# Patient Record
Sex: Female | Born: 1965 | Race: Black or African American | Hispanic: No | Marital: Married | Smoking: Never smoker
Health system: Southern US, Community
[De-identification: ages and names within clinical notes are randomized; demographics above are authoritative.]

## PROBLEM LIST (undated history)

## (undated) DIAGNOSIS — N920 Excessive and frequent menstruation with regular cycle: Secondary | ICD-10-CM

## (undated) DIAGNOSIS — J45909 Unspecified asthma, uncomplicated: Secondary | ICD-10-CM

## (undated) DIAGNOSIS — O139 Gestational [pregnancy-induced] hypertension without significant proteinuria, unspecified trimester: Secondary | ICD-10-CM

## (undated) DIAGNOSIS — E669 Obesity, unspecified: Secondary | ICD-10-CM

## (undated) DIAGNOSIS — I10 Essential (primary) hypertension: Secondary | ICD-10-CM

## (undated) HISTORY — DX: Obesity, unspecified: E66.9

## (undated) HISTORY — DX: Unspecified asthma, uncomplicated: J45.909

## (undated) HISTORY — DX: Excessive and frequent menstruation with regular cycle: N92.0

## (undated) HISTORY — DX: Essential (primary) hypertension: I10

## (undated) HISTORY — PX: TUBAL LIGATION: SHX77

## (undated) HISTORY — DX: Gestational (pregnancy-induced) hypertension without significant proteinuria, unspecified trimester: O13.9

---

## 1993-10-17 DIAGNOSIS — O139 Gestational [pregnancy-induced] hypertension without significant proteinuria, unspecified trimester: Secondary | ICD-10-CM

## 1993-10-17 HISTORY — DX: Gestational (pregnancy-induced) hypertension without significant proteinuria, unspecified trimester: O13.9

## 2007-10-18 HISTORY — PX: TUBAL LIGATION: SHX77

## 2007-10-18 HISTORY — PX: ENDOMETRIAL ABLATION: SHX621

## 2007-11-20 ENCOUNTER — Other Ambulatory Visit: Admission: RE | Admit: 2007-11-20 | Discharge: 2007-11-20 | Payer: Self-pay | Admitting: Obstetrics and Gynecology

## 2007-12-04 ENCOUNTER — Encounter: Admission: RE | Admit: 2007-12-04 | Discharge: 2007-12-04 | Payer: Self-pay | Admitting: Obstetrics and Gynecology

## 2008-03-07 ENCOUNTER — Ambulatory Visit (HOSPITAL_COMMUNITY): Admission: RE | Admit: 2008-03-07 | Discharge: 2008-03-07 | Payer: Self-pay | Admitting: Obstetrics & Gynecology

## 2008-11-20 ENCOUNTER — Other Ambulatory Visit: Admission: RE | Admit: 2008-11-20 | Discharge: 2008-11-20 | Payer: Self-pay | Admitting: Obstetrics and Gynecology

## 2008-12-04 ENCOUNTER — Encounter: Admission: RE | Admit: 2008-12-04 | Discharge: 2008-12-04 | Payer: Self-pay | Admitting: Obstetrics and Gynecology

## 2009-05-19 IMAGING — MG MM SCREEN MAMMOGRAM BILATERAL
4 series · 4 of 4 positions shown · non-contrast
Comparison: none

DG SCREEN MAMMOGRAM BILATERAL
Bilateral CC and MLO view(s) were taken.

DIGITAL SCREENING MAMMOGRAM WITH CAD:
The breast tissue is heterogeneously dense.  No masses or malignant type calcifications are 
identified.  Compared with prior studies.

[R CC]
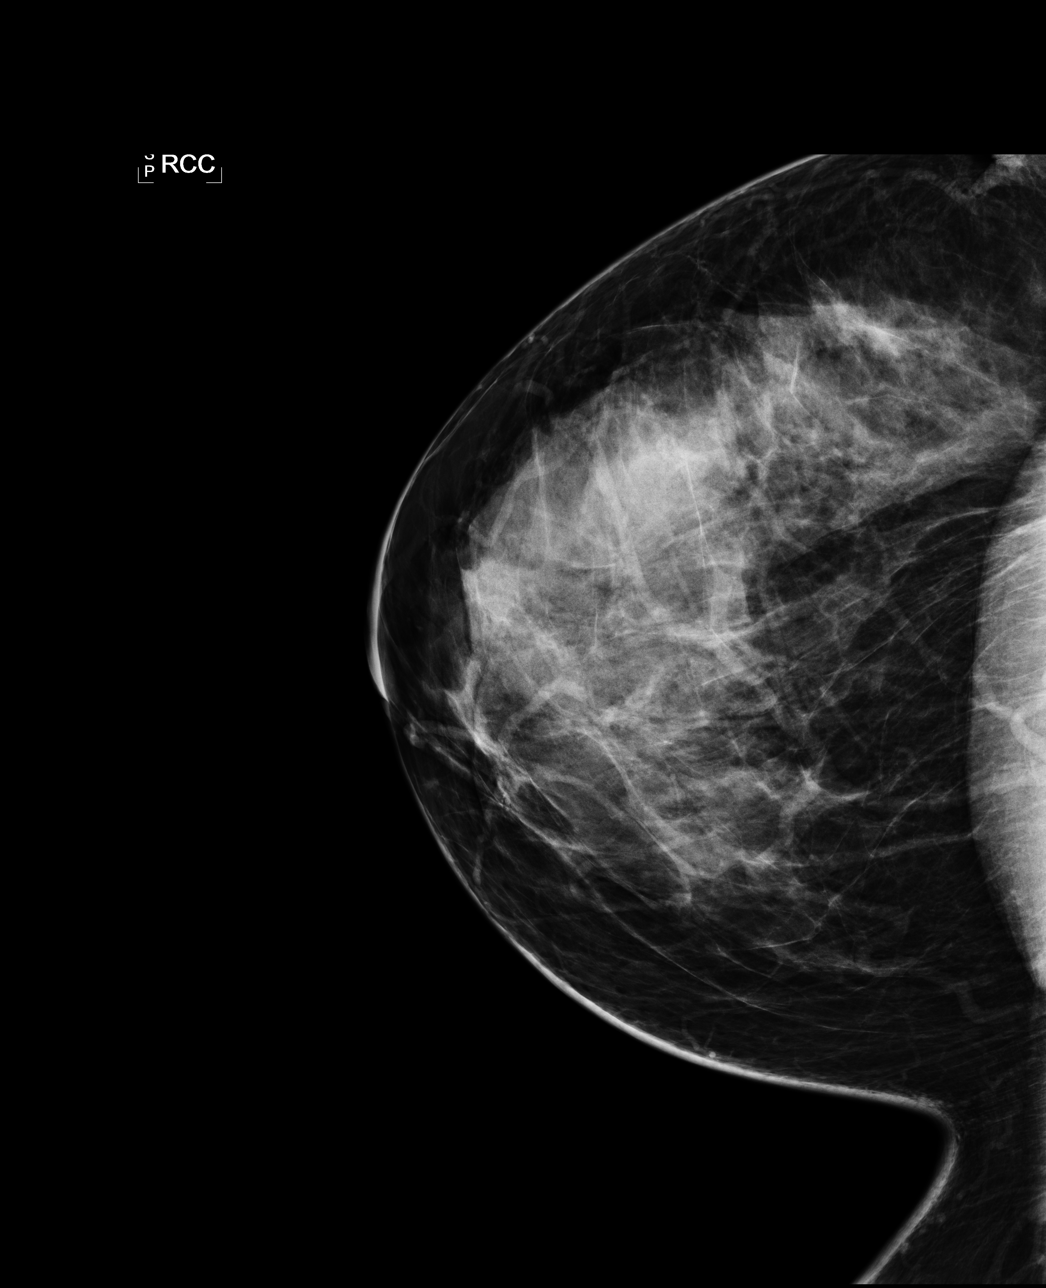

[L CC]
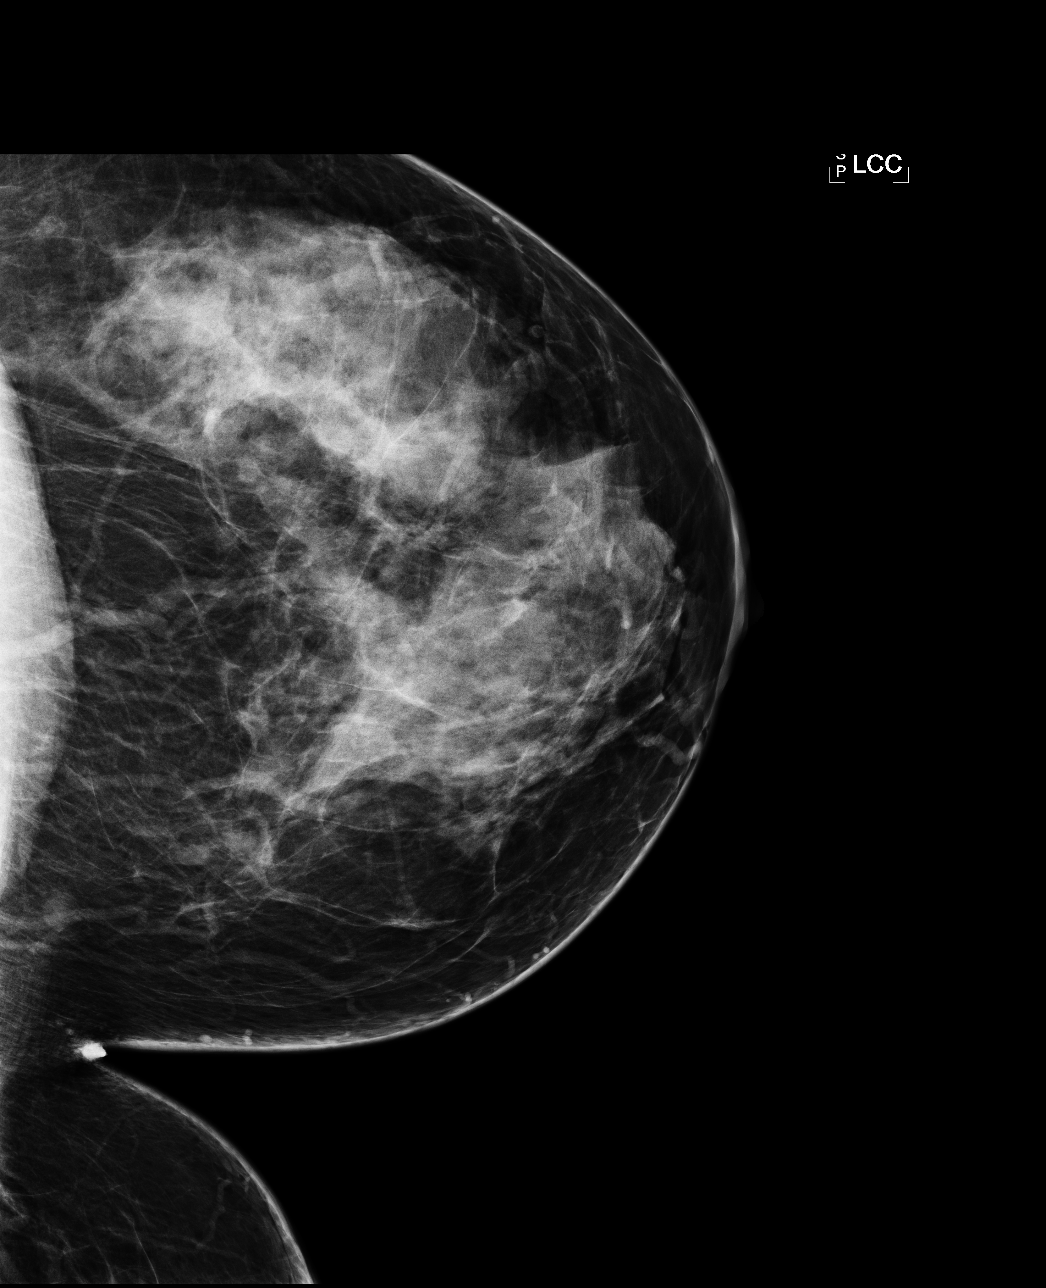

[L MLO]
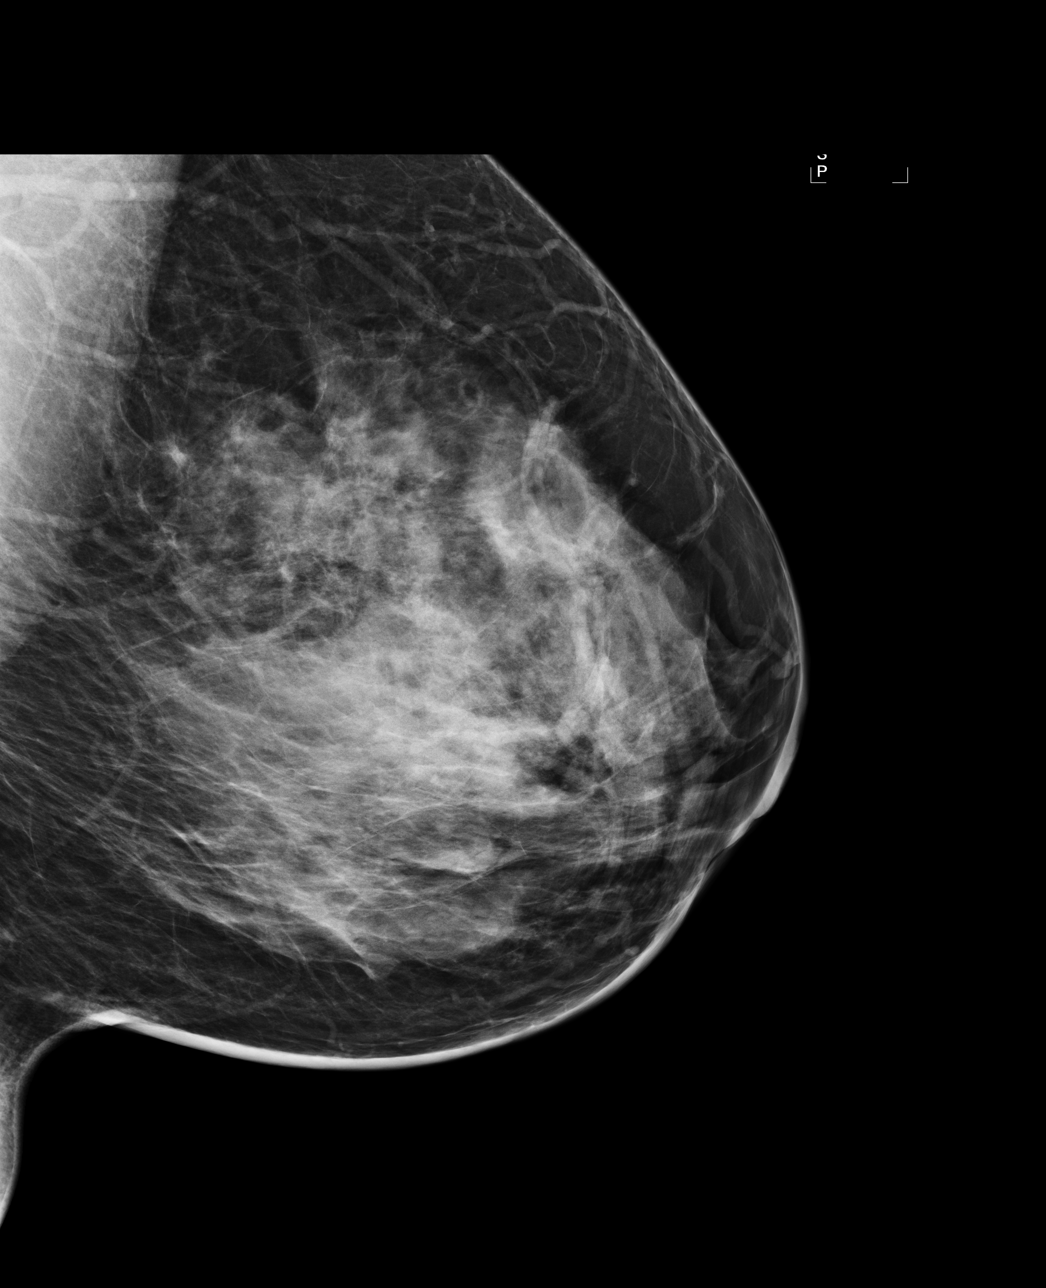

[R MLO]
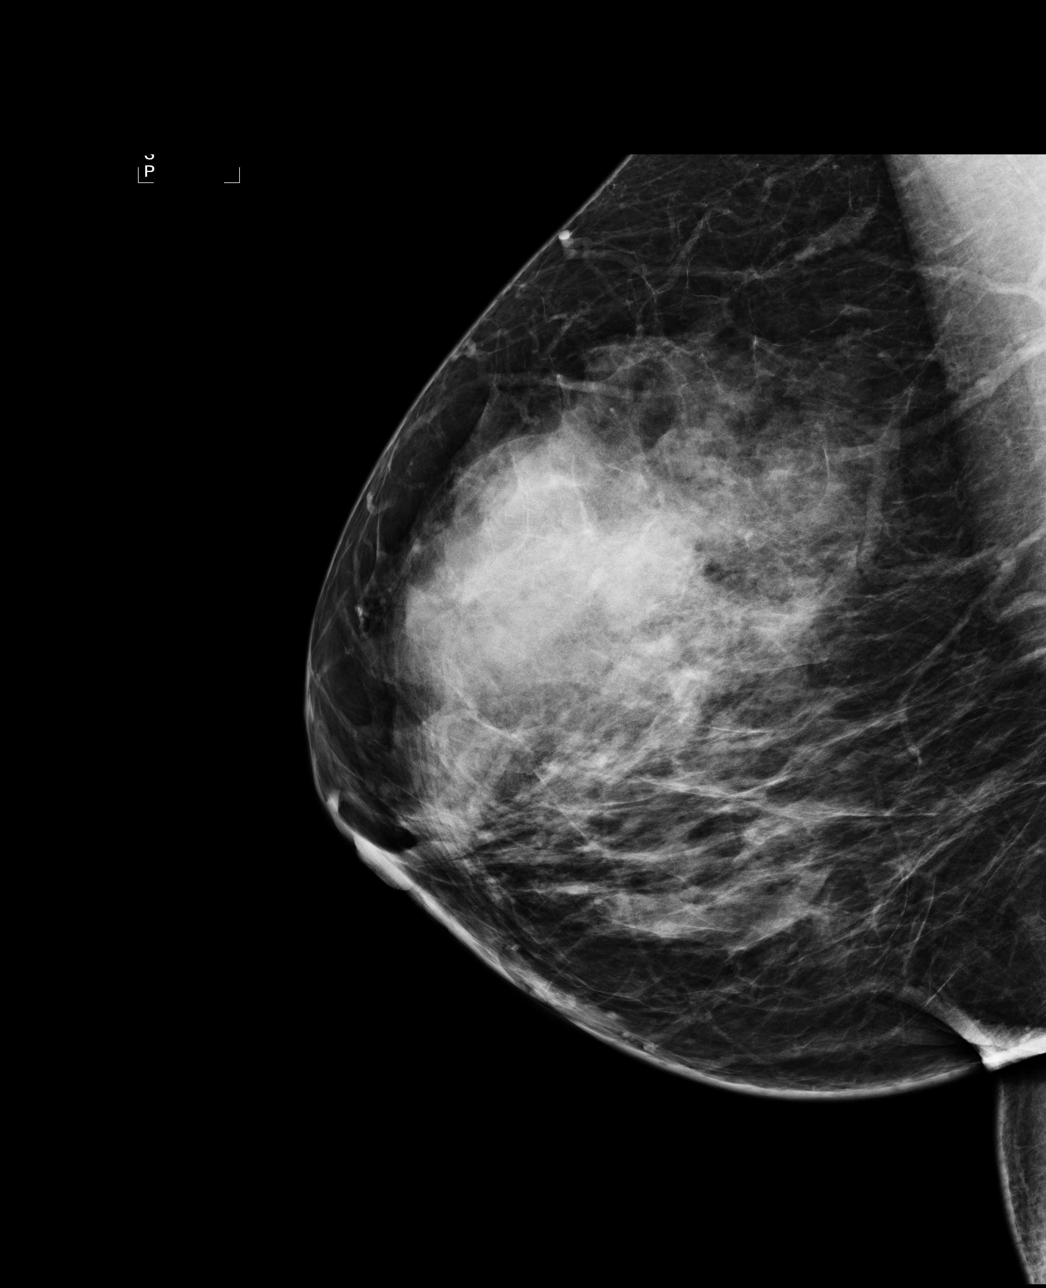

[4 of 4 positions shown; findings below may reference images not displayed]

IMPRESSION: No specific mammographic evidence of malignancy.  Next screening mammogram is recommended in one 
year.

ASSESSMENT: Negative - BI-RADS 1

Screening mammogram in 1 year.
ANALYZED BY COMPUTER AIDED DETECTION. , THIS PROCEDURE WAS A DIGITAL MAMMOGRAM.

## 2009-12-03 ENCOUNTER — Encounter: Admission: RE | Admit: 2009-12-03 | Discharge: 2009-12-03 | Payer: Self-pay | Admitting: Obstetrics & Gynecology

## 2010-06-29 ENCOUNTER — Ambulatory Visit: Payer: Self-pay | Admitting: Cardiovascular Disease

## 2010-11-08 ENCOUNTER — Other Ambulatory Visit: Payer: Self-pay | Admitting: Obstetrics and Gynecology

## 2010-11-08 DIAGNOSIS — Z1231 Encounter for screening mammogram for malignant neoplasm of breast: Secondary | ICD-10-CM

## 2010-11-08 DIAGNOSIS — Z1239 Encounter for other screening for malignant neoplasm of breast: Secondary | ICD-10-CM

## 2010-11-26 ENCOUNTER — Ambulatory Visit
Admission: RE | Admit: 2010-11-26 | Discharge: 2010-11-26 | Disposition: A | Discharge status: Home / Self Care | Payer: Managed Care, Other (non HMO) | Source: Ambulatory Visit | Attending: Obstetrics and Gynecology | Admitting: Obstetrics and Gynecology

## 2010-11-26 DIAGNOSIS — Z1231 Encounter for screening mammogram for malignant neoplasm of breast: Secondary | ICD-10-CM

## 2010-12-29 ENCOUNTER — Ambulatory Visit (INDEPENDENT_AMBULATORY_CARE_PROVIDER_SITE_OTHER): Payer: Managed Care, Other (non HMO) | Admitting: Cardiovascular Disease

## 2010-12-29 DIAGNOSIS — I119 Hypertensive heart disease without heart failure: Secondary | ICD-10-CM

## 2011-03-01 NOTE — Op Note (Signed)
NAMEASHAWNA, HANBACK              ACCOUNT NO.:  [id number]   MEDICAL RECORD NO.:  [id number]          PATIENT TYPE:  AMB   LOCATION:  SDC                           FACILITY:  [facility]   PHYSICIAN:  M. Leda Quail, MD  DATE OF BIRTH:  March 30, 1966   DATE OF PROCEDURE:  03/07/2008  DATE OF DISCHARGE:                               OPERATIVE REPORT   PREOPERATIVE DIAGNOSES:  42. A 45 year old G2, P2, African American female with menorrhagia.  2. Endometrial polyp on sonohysterogram.  3. Desire sterility.  4. History of elevated blood pressures with pressors containing birth      control pills and inability to tolerate these.   POSTOPERATIVE DIAGNOSES:  14. A 45 year old G2, P2, African American female with menorrhagia.  2. Endometrial polyp on sonohysterogram.  3. Desire sterility.  4. History of elevated blood pressures with pressors containing birth      control pills and inability to tolerate these.   PROCEDURE:  1. Hydrothermal ablation.  2. Laparoscopic bilateral tubal ligation with Filshie clip.   SURGEON:  M. Leda Quail, MD   ASSISTANT:  OR staff.   ANESTHESIA:  General endotracheal.   FINDINGS:  A slightly enlarged uterus on hysteroscopy.  No polyps in the  endometrium is present because of her preoperative Aygestin.  On  laparoscopy, she has normal-appearing tubes and ovaries, normal  appendix, normal upper abdomen.  Photo documentation of this was made  and in her chart.   SPECIMENS:  None.   ESTIMATED BLOOD LOSS:  Minimal.   FLUIDS:  1500 mL of LR.   URINE OUTPUT:  300 mL of clear urine drained with a red rubber Foley  catheter at the end of the procedure.   COMPLICATIONS:  None.   INDICATIONS:  Ms. Deretha Emory is very nice 45 year old G2, P2, divorced  African American female who has a history of menorrhagia.  She was tried  initially on birth control pills, but had inability to tolerate these  due to elevated blood pressures.  She was counseled on other  options and  has decided to proceed with an ablation.  She did have a sonohysterogram  done in the office, and this showed a possible polyp versus just of  fluffy endometrium.  Because of this, I did recommend a hydrothermal  ablation.  In the office, endometrial biopsy had been performed and this  showed no abnormal tissue.  She has never done anything for permanent  sterilization and has opted to proceed with the same time of this  procedure.  Risks and benefits have been explained and are documented in  her office chart, and she is here and ready to proceed.  Her blood  pressures in the holding area were elevated before the procedure.  She  was given two doses of labetalol.  She has no history of preexisting  hypertension and has no elevated documented blood pressures in her  office chart.  This is not a common side effect of Aygestin, there is  possibility that this is contributing to it.  With the labetalol, her  blood pressure did come down with diastolics  in the 90s and after  discussing this with anesthesia, we felt it was safe to proceed today.   PROCEDURE IN DETAIL:  The patient was taken to the operating room.  She  was placed in the supine position.  General endotracheal anesthesia was  administered by the anesthesia staff without difficulty.  Legs were  positioned in the low lithotomy position in [facility] stirrups.  She did  have SCD's on bilateral lower extremities.  The legs were elevated and  the patient was prepped with a Betadine prep.  Her abdomen, perineum,  inner thighs, and vagina were prepped using a sterile technique.  The  patient was then draped in normal sterile fashion.  Attention was turned  toward the vagina.  Bivalved speculum was placed in the vagina.  The  anterior lip of the cervix was grasped with a single-tooth tenaculum.  The uterus sounded to 9 cm.  The cervix was then dilated up to #21.  A 5-  mm hysteroscope with the VAC apparatus attached to it was  obtained.  Attempt to pass this through the cervical canal was made.  Ultimately,  the cervix had to be dilated up to a #23.  At this point, the  hysteroscope was advanced through the cervix and had a very tight seal  even dilating up to #23.  The cavity was visualized and tubal ostia were  noted bilaterally.  Photo documentation of the thin endometrium and  tubal ostia was made.  There was no evidence of polyps with hysteroscopy  at this point.  The scope was positioned in the lower uterine segment.  At this point, the ablation was initiated.  The hysteroscopic fluid was  heated to approximately 90 degrees Celsius.  Then a 10-minute ablation  cycle was performed without difficulty.  Good blanching of the  endometrial cavity was noted and photo documentation was made.  The cool  cycle was performed and only after this was complete, the scope was  removed from the cervix.  The bivalved speculum was still in place at  this point, and an acorn uterine manipulator was passed through the  cervical os and attached to the tenaculum as a mean to manipulate the  uterus over the next portion of the procedure.  At this point, the  speculum was removed.  The legs were positioned down into the low  lithotomy position.  Attention was turned to the abdomen at this point.  Marcaine 0.5% was used to anesthetize beneath the umbilicus.  5 mL of  this was used.  Using a #11 blade a 1-cm skin incision is made beneath  the umbilicus.  The subcutaneous fat tissue was dissected with a  hemostat.  The abdomen was elevated, and a Veress needle was passed  through the abdominal wall layers once popped.  Visceral peritoneum was  noted.  A syringe of normal saline was obtained.  This was attached to  the Veress needle.  An aspiration was performed without any fluid or  blood being noted and then fluid was injected and aspiration was  performed again without return of any fluid.  Fluid dripped easily down  the Veress  needle.  At this point, CO2 gas was attached to the Veress  needle, and pneumoperitoneum was initiated under low flows.  There were  no pressures greater than 7 mmHg as the pneumoperitoneum was being  released.  Once 2.5 mL of CO2 gas was in place, the Veress needle was  removed.  The abdominal wall  layer was then grasped again and elevated.  The diagnostic laparoscope was attached to a #10 Optiview port and  trocar.  Using a twisting motion, this was passed through the abdominal  wall layers.  They were visualized as they were traversed.  Once  intraperitoneal placement was noted, the trocar was removed and the  laparoscope was placed down in the port without difficulty.  Patient was  placed in trendelenberg positioning.  Pelvis and upper abdomen were  surveyed.  Decision had been made to go ahead and place Filshie clips on  the tubes.  The tubes were both identified and carried out to the  fimbriated end.  Then Filshie clips were placed bilaterally on the  isthmic region of the fallopian tube.  This was done without difficulty.  Photo documentation of this was made.  At this point, this portion of  the procedure was ended.  Patient placed back in supine position.  The  laparoscope was removed.  The pneumoperitoneum was released.  Several  good breaths from the anesthesiologist was given to ensure that the  abdomen was emptying as much her carbon dioxide as possible.  The  midline port was removed.  The operator's index finger was placed in  this incision to ensure there was no bowel in place.  Using S  retractors, the fascial edges were visualized and grasped with pickup  teeth.  Using figure-of-eight suture of #0 Vicryl, the fascia was closed  in the midline.  These sutures were cut short.  Skin was closed with  subcuticular stitch of 3-0 Vicryl.  Skin was then cleansed and a  dressing was placed above it.  At this point, legs were lifted into the  high lithotomy position.  The  instruments were removed from the vagina  and using a speculum the cervix was visualized.  There was a small  amount of bleeding from the right tenaculum site on the anterior lip of  the cervix.  This was made hemostatic with silver nitrate.  At this  point, the procedure was completely done and the speculum was removed.  Legs were positioned back in the supine position.   The patient tolerated the procedure well.  She was awaken from  anesthesia without difficulty.  She was very stable during the  procedure, and her blood pressures came down very nicely with  anesthesia.  Sponge, lap, needle, and instrument counts were correct x2.  The patient at this point was taken to the recovery room in stable  condition.      Lum Keas, MD  Electronically Signed     MSM/MEDQ  D:  03/07/2008  T:  03/08/2008  Job:  [id number]

## 2011-03-09 ENCOUNTER — Ambulatory Visit (INDEPENDENT_AMBULATORY_CARE_PROVIDER_SITE_OTHER): Payer: Managed Care, Other (non HMO) | Admitting: Family Medicine

## 2011-03-09 ENCOUNTER — Encounter: Payer: Self-pay | Admitting: Family Medicine

## 2011-03-09 VITALS — BP 140/100 | HR 71 | Temp 98.3°F | Ht 67.0 in | Wt 199.0 lb

## 2011-03-09 DIAGNOSIS — R030 Elevated blood-pressure reading, without diagnosis of hypertension: Secondary | ICD-10-CM

## 2011-03-09 DIAGNOSIS — Z0000 Encounter for general adult medical examination without abnormal findings: Secondary | ICD-10-CM

## 2011-03-09 DIAGNOSIS — IMO0001 Reserved for inherently not codable concepts without codable children: Secondary | ICD-10-CM

## 2011-03-09 LAB — BASIC METABOLIC PANEL
BUN: 10 mg/dL (ref 6–23)
Calcium: 9.2 mg/dL (ref 8.4–10.5)
Potassium: 3.9 mEq/L (ref 3.5–5.3)
Sodium: 139 mEq/L (ref 135–145)

## 2011-03-09 NOTE — Patient Instructions (Signed)
Please take some blood pressure readings at home--maybe three times a week--at different times of the day (stressed and unstressed). Mail them to me and I will get in touch with you regarding the results I am checking a basic metabolic panel today and I will send you those results. Great to meet you! Keep up the great work with your diet and exercise!  Unless something comes up in your blood work or on the blood pressure readings, I will plan on seeing you next February for a CPE (complete physical exam)

## 2011-03-10 ENCOUNTER — Encounter: Payer: Self-pay | Admitting: Family Medicine

## 2011-03-10 DIAGNOSIS — Z0000 Encounter for general adult medical examination without abnormal findings: Secondary | ICD-10-CM | POA: Insufficient documentation

## 2011-03-10 NOTE — Progress Notes (Signed)
  Subjective:    Patient ID: Ashley Schneider, female    DOB: 04-12-1966, 45 y.o.   MRN: [id number]  HPI  New patient here to establish care 1)BP:   formerly very overweight, has lost 30 pounds with diet and exercise. Was on BP meds during her pregnancy and up until her weight loss--she has been off them several months and reports BP 130/70 range at home  Was on HCTZ and K Dur mopst recently and methyldopa during her pregnancy  2) Prevention: last pap normal  Feb 2012.  Also had mammogram then. Tdap 11/2010.  3) Menses previously heavy and had endometrial ablation and BTL  2009. G2P2  SH: divorced, 2 kids Ashley Schneider born 1995 and Ashley Schneider 1998, both live w her. She works as Sport and exercise psychologist for [employer]    complete 14 point ROS negative for pertinent positives Objective:   Physical Exam    GENERALl: Well developed,overweight;  well nourished, in no acute distress. NECK: Supple, FROM, without lymphadenopathy.  THYROID: normal without nodularity CAROTID ARTERIES: without bruits LUNGS: clear to auscultation bilaterally. No wheezes or rales. HEART: Regular rate and rhythm, no murmurs ABDOMEN: soft with positive bowel sounds NEURO: No gross focal deficits. DTrs 1+ B = U/L      Assessment & Plan:  1) elevated BP--she will take some readings and rtc 3-4w. Labs today encouraged continue great exercise and eating plan.

## 2011-03-15 ENCOUNTER — Telehealth: Payer: Self-pay | Admitting: Family Medicine

## 2011-03-15 NOTE — Telephone Encounter (Signed)
Does not sound like anything I would be really worried about. See if she can come in next 4-6 weeks THANKS! Denny Levy

## 2011-03-15 NOTE — Telephone Encounter (Signed)
Left message with pt's daughter Morrie Sheldon and asked her to tell her mom to call our office.Laureen Ochs, Viann Shove

## 2011-03-15 NOTE — Telephone Encounter (Signed)
Reports BP readings for the last 4 days: Friday -am - 141/100 Friday - pm- 140/98 Sun - 138/100 Mon - 139/101  Saw stars on Friday when she was just sitting at desk at work.Marland KitchenMarland Kitchen

## 2011-03-15 NOTE — Telephone Encounter (Signed)
Pt cannot come in this week or next and wants to know what Dr Jennette Kettle would recommend.

## 2011-03-15 NOTE — Telephone Encounter (Signed)
Spoke with patient and informed her to make an appointment with Dr. Jennette Kettle. Will also fwd results to Dr. Dellia Cloud, Roselyn Meier

## 2011-03-15 NOTE — Telephone Encounter (Signed)
What should I tell this pt?  ~T

## 2011-03-15 NOTE — Telephone Encounter (Signed)
Informed pt of what Dr. Jennette Kettle said and to make an appt in 4-6 weeks. Also advised her that if she starts feeling bad to make an appt to be seen before then.Laureen Ochs, Viann Shove '

## 2011-03-18 ENCOUNTER — Telehealth: Payer: Self-pay | Admitting: Cardiovascular Disease

## 2011-03-18 DIAGNOSIS — I10 Essential (primary) hypertension: Secondary | ICD-10-CM

## 2011-03-18 MED ORDER — HYDROCHLOROTHIAZIDE 25 MG PO TABS: 25.0000 mg | ORAL_TABLET | Freq: Every day | ORAL | Status: DC

## 2011-03-18 MED ORDER — POTASSIUM CHLORIDE 20 MEQ PO PACK: 20.0000 meq | PACK | Freq: Every day | ORAL | Status: DC

## 2011-03-18 NOTE — Telephone Encounter (Signed)
Saw pcp dr Lloyd Huger, wants your advise though, bp been 138/96, 136/ 101, 142/102, dr Lloyd Huger wanted to start her on diazide but she was resistant, she has lost weight and exercises 4x week. Wants you to make medication decision.Alfonso Ramus RN

## 2011-03-18 NOTE — Telephone Encounter (Signed)
meds ordered and lab ordered. Keep checking bp daily.Pt verbalized understanding. Alfonso Ramus RN

## 2011-03-18 NOTE — Telephone Encounter (Signed)
Pt said her bp has been above 135/96 for couple days she went to pcp and dr wanted to start her on a pill with hct.potassium combined please call back

## 2011-03-18 NOTE — Telephone Encounter (Signed)
She persuaded me to stop her BP meds when I saw her last visit.  It appears that her BP has gone  Back up and I would also advise that she go back on her meds.  She can take the diazide or resume the HCTZ  25and KCl  20 meq  that I had her on.

## 2011-04-05 ENCOUNTER — Telehealth: Payer: Self-pay | Admitting: Family Medicine

## 2011-04-05 NOTE — Telephone Encounter (Signed)
Pt seen on 03/09/11, says it was supposed to be for her annual physical but was not coded correctly therefore pt is being billed, needs MD to change dx code so that the billing office can correct & pts ins will pay.

## 2011-04-11 NOTE — Telephone Encounter (Signed)
Babs She WAS scheduled for CPE--actually took care of her BP mostly so I coded it that way BUT it could go either way. Evidently easier for her if it is CPE so can you change that for her? THANKS! Denny Levy

## 2011-05-19 ENCOUNTER — Telehealth: Payer: Self-pay | Admitting: Family Medicine

## 2011-05-19 NOTE — Telephone Encounter (Signed)
[name] is calling to make sure that the coding change has been made.  Please give her a call.

## 2011-06-30 NOTE — Telephone Encounter (Signed)
Unfortunately I can' go into the orders screen.  I don't have access to that.

## 2011-07-04 NOTE — Telephone Encounter (Signed)
I have put this on Denyas desk  I cannot alter the order I am routing to [employer] incase SHE has a way to fix this THANKS! Denny Levy

## 2011-07-13 LAB — URINALYSIS, ROUTINE W REFLEX MICROSCOPIC
Bilirubin Urine: NEGATIVE
Glucose, UA: NEGATIVE
Ketones, ur: 15 — AB
Protein, ur: 30 — AB
Specific Gravity, Urine: >1.03 — ABNORMAL HIGH
Urobilinogen, UA: 0.2
pH: 6

## 2011-07-13 LAB — CBC
Hemoglobin: 12.5
MCV: 84.4
RBC: 4.37
RDW: 13.7

## 2011-07-13 LAB — PREGNANCY, URINE: Preg Test, Ur: NEGATIVE

## 2011-07-13 LAB — URINE MICROSCOPIC-ADD ON

## 2011-07-13 NOTE — Telephone Encounter (Signed)
Will submit a ticket on this.

## 2011-10-13 ENCOUNTER — Ambulatory Visit (INDEPENDENT_AMBULATORY_CARE_PROVIDER_SITE_OTHER): Payer: Managed Care, Other (non HMO) | Admitting: *Deleted

## 2011-10-13 VITALS — BP 136/96 | HR 64

## 2011-10-13 DIAGNOSIS — Z0000 Encounter for general adult medical examination without abnormal findings: Secondary | ICD-10-CM

## 2011-10-13 NOTE — Progress Notes (Signed)
Form signed by Dr. Jennette Kettle and faxed.

## 2011-10-13 NOTE — Progress Notes (Signed)
Patient comes to office today  requesting BP check , waist measurement , and recording of lab results she obtained  on 10/10/2011 at [facility] in [place], [place]. She states she was fasting for this bloodwork.   She has a form from [employer],  Research scientist (physical sciences) for [employer] that she requesst MD sign and fax to number provided on form.   She signed ROI to release this information.  BP today LA 136/96 pulse 64 , waist measurement 38 inches. Form  Placed in MD box.

## 2011-10-20 ENCOUNTER — Other Ambulatory Visit: Payer: Self-pay | Admitting: Obstetrics and Gynecology

## 2011-10-20 DIAGNOSIS — Z1231 Encounter for screening mammogram for malignant neoplasm of breast: Secondary | ICD-10-CM

## 2011-12-12 LAB — HM PAP SMEAR: HM Pap smear: NEGATIVE

## 2011-12-21 ENCOUNTER — Ambulatory Visit
Admission: RE | Admit: 2011-12-21 | Discharge: 2011-12-21 | Disposition: A | Discharge status: Home / Self Care | Payer: Managed Care, Other (non HMO) | Source: Ambulatory Visit | Attending: Obstetrics and Gynecology | Admitting: Obstetrics and Gynecology

## 2011-12-21 DIAGNOSIS — Z1231 Encounter for screening mammogram for malignant neoplasm of breast: Secondary | ICD-10-CM

## 2012-01-10 ENCOUNTER — Encounter: Payer: Self-pay | Admitting: *Deleted

## 2012-03-13 ENCOUNTER — Telehealth: Payer: Self-pay | Admitting: Cardiovascular Disease

## 2012-03-13 MED ORDER — POTASSIUM CHLORIDE 20 MEQ PO PACK: 20.0000 meq | PACK | Freq: Every day | ORAL | Status: DC

## 2012-03-13 MED ORDER — HYDROCHLOROTHIAZIDE 25 MG PO TABS: 25.0000 mg | ORAL_TABLET | Freq: Every day | ORAL | Status: DC

## 2012-03-13 NOTE — Telephone Encounter (Signed)
New Problem:    Patient called in needing a refill of her hydrochlorothiazide 25 MG tablet and potassium chloride (KLOR-CON) 20 MEQ packet.

## 2012-03-13 NOTE — Telephone Encounter (Signed)
Pt needs appointment then refill can be made Fax Received. Refill Completed. Shirly Bartosiewicz Chowoe (R.M.A)   

## 2012-03-30 ENCOUNTER — Ambulatory Visit (INDEPENDENT_AMBULATORY_CARE_PROVIDER_SITE_OTHER): Payer: Managed Care, Other (non HMO) | Admitting: Nurse Practitioner

## 2012-03-30 ENCOUNTER — Encounter: Payer: Self-pay | Admitting: Nurse Practitioner

## 2012-03-30 VITALS — BP 130/90 | HR 71 | Ht 67.0 in | Wt 207.2 lb

## 2012-03-30 DIAGNOSIS — I10 Essential (primary) hypertension: Secondary | ICD-10-CM

## 2012-03-30 MED ORDER — HYDROCHLOROTHIAZIDE 25 MG PO TABS: 25.0000 mg | ORAL_TABLET | Freq: Every day | ORAL | Status: DC

## 2012-03-30 MED ORDER — POTASSIUM CHLORIDE 20 MEQ PO PACK: 20.0000 meq | PACK | Freq: Every day | ORAL | Status: DC

## 2012-03-30 NOTE — Progress Notes (Addendum)
   Ashley Schneider Date of Birth: 1966/03/21 Medical Record #[id number]  History of Present Illness: Ashley Schneider is seen today for a work in visit. She is seen for Dr. Elease Hashimoto. She needs medicines refilled. She has HTN and obesity. Her last visit was in March of 2012.   She comes in today. She is here alone. She is doing well. No complaints. Needed medicines refilled. Her weight has gone back up unfortunately. She notes that when she loses weight it is only in her upper chest and because her breasts are so large, her back hurts more. No cardiac complaints. Blood pressure at home has been good. She monitors her pressure regularly. Has had all of her labs with her PCP. In general, she has no complaint.   Current Outpatient Prescriptions on File Prior to Visit  Medication Sig Dispense Refill  . BIOTIN PO Take by mouth.      . hydrochlorothiazide (HYDRODIURIL) 25 MG tablet Take 1 tablet (25 mg total) by mouth daily.  90 tablet  0  . Multiple Vitamin (MULTIVITAMIN) tablet Take 1 tablet by mouth daily.      . potassium chloride (KLOR-CON) 20 MEQ packet Take 20 mEq by mouth daily.  90 tablet  0    Allergies  Allergen Reactions  . Penicillins     Past Medical History  Diagnosis Date  . Hypertension   . Pregnancy induced hypertension 1995    Past Surgical History  Procedure Date  . Tubal ligation   . Endometrial ablation 2009    History  Smoking status  . Never Smoker   Smokeless tobacco  . Never Used    History  Alcohol Use No    Family History  Problem Relation Age of Onset  . Hypertension Mother   . Diabetic kidney disease Father     Review of Systems: The review of systems is per the HPI.  All other systems were reviewed and are negative.  Physical Exam: BP 140/94  Pulse 71  Ht 5\' 7"  (1.702 m)  Wt 207 lb 3.2 oz (93.985 kg)  BMI 32.45 kg/m2 Repeat blood pressure by me was 130/90 (she had rushed here because she went to the wrong office) Patient is very pleasant  and in no acute distress. She is obese. Skin is warm and dry. Color is normal.  HEENT is unremarkable. Normocephalic/atraumatic. PERRL. Sclera are nonicteric. Neck is supple. No masses. No JVD. Lungs are clear. Cardiac exam shows a regular rate and rhythm. Abdomen is soft. Extremities are without edema. Gait and ROM are intact. No gross neurologic deficits noted.   LABORATORY DATA: N/A  EKG today shows sinus rhythm and is normal.   Assessment / Plan:

## 2012-03-30 NOTE — Assessment & Plan Note (Signed)
Blood pressure is ok at home. Her medicines are refilled. We will see her back in a year. She will continue to monitor her blood pressure at home. Patient is agreeable to this plan and will call if any problems develop in the interim.

## 2012-03-30 NOTE — Patient Instructions (Signed)
   Continue with your current medicines.  Monitor your blood pressure at home.   Record your readings and bring to your next visit.  Limit sodium intake.  We will see you back in a year.  Call the [facility] office at [phone] if you have any questions, problems or concerns.

## 2012-08-17 ENCOUNTER — Ambulatory Visit (HOSPITAL_COMMUNITY)
Admission: RE | Admit: 2012-08-17 | Discharge: 2012-08-17 | Disposition: A | Discharge status: Home / Self Care | Payer: Managed Care, Other (non HMO) | Source: Ambulatory Visit | Attending: Family Medicine | Admitting: Family Medicine

## 2012-08-17 ENCOUNTER — Ambulatory Visit (INDEPENDENT_AMBULATORY_CARE_PROVIDER_SITE_OTHER): Payer: Managed Care, Other (non HMO) | Admitting: Family Medicine

## 2012-08-17 ENCOUNTER — Encounter: Payer: Self-pay | Admitting: Family Medicine

## 2012-08-17 VITALS — BP 126/89 | HR 76 | Ht 67.0 in | Wt 198.0 lb

## 2012-08-17 DIAGNOSIS — R079 Chest pain, unspecified: Secondary | ICD-10-CM | POA: Insufficient documentation

## 2012-08-17 DIAGNOSIS — I10 Essential (primary) hypertension: Secondary | ICD-10-CM

## 2012-08-17 DIAGNOSIS — Z0000 Encounter for general adult medical examination without abnormal findings: Secondary | ICD-10-CM

## 2012-08-17 LAB — COMPREHENSIVE METABOLIC PANEL
Alkaline Phosphatase: 86 U/L (ref 39–117)
BUN: 7 mg/dL (ref 6–23)
CO2: 29 mEq/L (ref 19–32)
Calcium: 9.2 mg/dL (ref 8.4–10.5)
Chloride: 99 mEq/L (ref 96–112)
Glucose, Bld: 81 mg/dL (ref 70–99)
Potassium: 3.1 mEq/L — ABNORMAL LOW (ref 3.5–5.3)
Sodium: 138 mEq/L (ref 135–145)
Total Protein: 7.2 g/dL (ref 6.0–8.3)

## 2012-08-17 LAB — POCT URINALYSIS DIPSTICK
Bilirubin, UA: NEGATIVE
Ketones, UA: NEGATIVE
Nitrite, UA: NEGATIVE
Protein, UA: NEGATIVE
Spec Grav, UA: 1.02
Urobilinogen, UA: 0.2

## 2012-08-17 LAB — CBC
MCH: 30.5 pg (ref 26.0–34.0)
MCV: 86.5 fL (ref 78.0–100.0)
RDW: 12.6 % (ref 11.5–15.5)
WBC: 5 10*3/uL (ref 4.0–10.5)

## 2012-08-17 LAB — LIPID PANEL
LDL Cholesterol: 99 mg/dL (ref 0–99)
VLDL: 30 mg/dL (ref 0–40)

## 2012-08-17 LAB — TSH: TSH: 0.443 u[IU]/mL (ref 0.350–4.500)

## 2012-08-17 NOTE — Progress Notes (Signed)
  Subjective:     Ashley Schneider is a 46 y.o. female and is here for a comprehensive physical exam. The patient reports no problems. Is currently part of [employer], which is a personalized health plan through her insurance. Is interested in multiple "check up" labs that are covered by her insurance including: UA, EKG, CBC, CMET, FLP, TSH  History   Social History  . Marital Status: Single    Spouse Name: N/A    Number of Children: N/A  . Years of Education: N/A   Occupational History  . Not on file.   Social History Main Topics  . Smoking status: Never Smoker   . Smokeless tobacco: Never Used  . Alcohol Use: No  . Drug Use: No  . Sexually Active: Not Currently    Birth Control/ Protection: Other-see comments   Other Topics Concern  . Not on file   Social History Narrative  . No narrative on file   Health Maintenance  Topic Date Due  . Pap Smear  11/27/1985  . Tetanus/tdap  11/27/1986  . Influenza Vaccine  06/17/2012    The following portions of the patient's history were reviewed and updated as appropriate: allergies, current medications, past family history, past medical history, past social history, past surgical history and problem list.  Review of Systems A comprehensive review of systems was negative.   Objective:    BP 126/89  Pulse 76  Ht 5\' 7"  (1.702 m)  Wt 198 lb (89.812 kg)  BMI 31.01 kg/m2 General appearance: alert, cooperative, appears stated age and no distress Head: Normocephalic, without obvious abnormality, atraumatic Eyes: conjunctivae/corneas clear. PERRL, EOM's intact. Fundi benign. Ears: normal TM's and external ear canals both ears Nose: Nares normal. Septum midline. Mucosa normal. No drainage or sinus tenderness. Throat: lips, mucosa, and tongue normal; teeth and gums normal Neck: no adenopathy, supple, symmetrical, trachea midline and thyroid not enlarged, symmetric, no tenderness/mass/nodules Lungs: clear to auscultation  bilaterally Heart: regular rate and rhythm, S1, S2 normal, no murmur, click, rub or gallop Abdomen: soft, non-tender; bowel sounds normal; no masses,  no organomegaly Extremities: extremities normal, atraumatic, no cyanosis or edema Neurologic: Grossly normal    Assessment:    Healthy female exam. Pap done 11/2011 by GYN.     Plan:     See After Visit Summary for Counseling Recommendations

## 2012-08-17 NOTE — Patient Instructions (Signed)
It was nice to meet you today.  We checked your urine and EKG while you were here today.  Both looks perfect.  We will send a letter with your other lab results (CBC, TSH, CMET, FLP)- checking kidneys, liver, cholesterol, thyroid, blood counts.  Please come back for any issues or problems.

## 2012-08-17 NOTE — Assessment & Plan Note (Signed)
Doing well, no concerns or complaints.  Feels great. Encouraged continued weight loss. Checked preventative labs covered by insurance and desired by pt. UA and EKG completely normal.  Will send letter with other results.  Pt gave me paper that needs to be filled out and faxed to insurance company.  Will complete once labs are back.

## 2012-08-17 NOTE — Assessment & Plan Note (Signed)
Well controlled today. Continue HCTZ.  

## 2012-08-19 ENCOUNTER — Encounter: Payer: Self-pay | Admitting: Family Medicine

## 2012-11-20 ENCOUNTER — Other Ambulatory Visit: Payer: Self-pay | Admitting: Obstetrics and Gynecology

## 2012-11-20 DIAGNOSIS — Z1231 Encounter for screening mammogram for malignant neoplasm of breast: Secondary | ICD-10-CM

## 2012-12-21 ENCOUNTER — Ambulatory Visit: Payer: Managed Care, Other (non HMO)

## 2012-12-26 ENCOUNTER — Ambulatory Visit
Admission: RE | Admit: 2012-12-26 | Discharge: 2012-12-26 | Disposition: A | Discharge status: Home / Self Care | Payer: Managed Care, Other (non HMO) | Source: Ambulatory Visit | Attending: Obstetrics and Gynecology | Admitting: Obstetrics and Gynecology

## 2012-12-26 DIAGNOSIS — Z1231 Encounter for screening mammogram for malignant neoplasm of breast: Secondary | ICD-10-CM

## 2012-12-27 ENCOUNTER — Other Ambulatory Visit: Payer: Self-pay | Admitting: Obstetrics and Gynecology

## 2012-12-27 DIAGNOSIS — R928 Other abnormal and inconclusive findings on diagnostic imaging of breast: Secondary | ICD-10-CM

## 2013-01-08 ENCOUNTER — Ambulatory Visit
Admission: RE | Admit: 2013-01-08 | Discharge: 2013-01-08 | Disposition: A | Discharge status: Home / Self Care | Payer: Managed Care, Other (non HMO) | Source: Ambulatory Visit | Attending: Obstetrics and Gynecology | Admitting: Obstetrics and Gynecology

## 2013-01-08 DIAGNOSIS — R928 Other abnormal and inconclusive findings on diagnostic imaging of breast: Secondary | ICD-10-CM

## 2013-05-01 ENCOUNTER — Other Ambulatory Visit: Payer: Self-pay

## 2013-05-01 MED ORDER — POTASSIUM CHLORIDE 20 MEQ PO PACK: 20.0000 meq | PACK | Freq: Every day | ORAL | Status: DC

## 2013-05-01 MED ORDER — HYDROCHLOROTHIAZIDE 25 MG PO TABS: 25.0000 mg | ORAL_TABLET | Freq: Every day | ORAL | Status: DC

## 2013-05-01 NOTE — Telephone Encounter (Signed)
Pt called to refill HCTZ 25mg  #90, R-3  and  KLOR-CON #30. R-3. She has an appt. for 05/29/2013 @1 :30. Refills sent to [facility] on [address]. Pt aware.

## 2013-05-29 ENCOUNTER — Ambulatory Visit (INDEPENDENT_AMBULATORY_CARE_PROVIDER_SITE_OTHER): Payer: Managed Care, Other (non HMO) | Admitting: Nurse Practitioner

## 2013-05-29 ENCOUNTER — Encounter: Payer: Self-pay | Admitting: Nurse Practitioner

## 2013-05-29 VITALS — BP 130/90 | HR 68 | Ht 67.0 in | Wt 209.4 lb

## 2013-05-29 DIAGNOSIS — I10 Essential (primary) hypertension: Secondary | ICD-10-CM

## 2013-05-29 LAB — BASIC METABOLIC PANEL
BUN: 8 mg/dL (ref 6–23)
CO2: 31 mEq/L (ref 19–32)
Calcium: 9.2 mg/dL (ref 8.4–10.5)
Chloride: 98 mEq/L (ref 96–112)
Creatinine, Ser: 1 mg/dL (ref 0.4–1.2)
GFR: 66.65 mL/min (ref >60.00)
Glucose, Bld: 97 mg/dL (ref 70–99)
Potassium: 2.9 mEq/L — ABNORMAL LOW (ref 3.5–5.1)
Sodium: 137 mEq/L (ref 135–145)

## 2013-05-29 NOTE — Progress Notes (Signed)
Ashley Schneider Date of Birth: 1966-08-04 Medical Record #[id number]  History of Present Illness: [name] is seen back today for a one year check. Seen for Dr. Elease Hashimoto. Has HTN and obesity. No known CAD.   Last seen last year for med refill.   Comes back today. Here alone. Doing ok. Has gained some weight. Just getting back into an exercise routine - did a 10K earlier this year and hurt her right ankle. Seeing ortho back tomorrow. BP at home is better than here. Tolerating her medicines. Needs her potassium rechecked. No chest pain. Not short of breath.   Current Outpatient Prescriptions  Medication Sig Dispense Refill  . BIOTIN PO Take by mouth.      . hydrochlorothiazide (HYDRODIURIL) 25 MG tablet Take 1 tablet (25 mg total) by mouth daily.  90 tablet  3  . Multiple Vitamin (MULTIVITAMIN) tablet Take 1 tablet by mouth daily.      . potassium chloride (KLOR-CON) 20 MEQ packet Take 20 mEq by mouth daily.  30 tablet  3   No current facility-administered medications for this visit.    Allergies  Allergen Reactions  . Penicillins     Past Medical History  Diagnosis Date  . Hypertension   . Pregnancy induced hypertension 1995  . Obesity     Past Surgical History  Procedure Laterality Date  . Tubal ligation    . Endometrial ablation  2009    History  Smoking status  . Never Smoker   Smokeless tobacco  . Never Used    History  Alcohol Use No    Family History  Problem Relation Age of Onset  . Hypertension Mother   . Diabetic kidney disease Father     Review of Systems: The review of systems is per the HPI.  All other systems were reviewed and are negative.  Physical Exam: BP 130/90  Pulse 68  Ht 5\' 7"  (1.702 m)  Wt 209 lb 6.4 oz (94.983 kg)  BMI 32.79 kg/m2 Patient is very pleasant and in no acute distress. She remains obese. Skin is warm and dry. Color is normal.  HEENT is unremarkable. Normocephalic/atraumatic. PERRL. Sclera are nonicteric. Neck is  supple. No masses. No JVD. Lungs are clear. Cardiac exam shows a regular rate and rhythm. Abdomen is soft. Extremities are without edema. Gait and ROM are intact. No gross neurologic deficits noted.  LABORATORY DATA:  Lab Results  Component Value Date   WBC 5.0 08/17/2012   HGB 13.1 08/17/2012   HCT 37.1 08/17/2012   PLT 316 08/17/2012   GLUCOSE 81 08/17/2012   CHOL 161 08/17/2012   TRIG 152* 08/17/2012   HDL 32* 08/17/2012   LDLCALC 99 08/17/2012   ALT 12 08/17/2012   AST 22 08/17/2012   NA 138 08/17/2012   K 3.1* 08/17/2012   CL 99 08/17/2012   CREATININE 0.94 08/17/2012   BUN 7 08/17/2012   CO2 29 08/17/2012   TSH 0.443 08/17/2012    Assessment / Plan:  1. HTN - has better BP control at home. I have left her on her current regimen. Recheck a potassium level today. Refill meds as needed. See her back in one year.   2. Obesity   Patient is agreeable to this plan and will call if any problems develop in the interim.   Rosalio Macadamia, RN, ANP-C [facility] [address] [place], [place]  [address]   Patient is agreeable to this plan and will  call if any problems develop in the interim.   Rosalio Macadamia, RN, ANP-C [facility] [address] [place], [place]  [address]

## 2013-05-29 NOTE — Patient Instructions (Addendum)
We need to recheck your potassium level today  Stay on your current medicines  Stay active  We will see you back in a year  Call the [facility] office at [phone] if you have any questions, problems or concerns.

## 2013-05-30 ENCOUNTER — Telehealth: Payer: Self-pay | Admitting: Nurse Practitioner

## 2013-05-30 DIAGNOSIS — E876 Hypokalemia: Secondary | ICD-10-CM

## 2013-05-30 MED ORDER — POTASSIUM CHLORIDE 20 MEQ PO PACK: PACK | ORAL | Status: DC

## 2013-05-30 NOTE — Telephone Encounter (Signed)
Message copied by Levi Aland on Thu May 30, 2013 12:21 PM ------      Message from: Rosalio Macadamia      Created: Thu May 30, 2013  7:57 AM       Ok to report. Labs are satisfactory but potassium is quite low - make sure she is taking her potassium      Needs to increase her dose to 20 meq TID for three days, then BID      Recheck BMET early next week. ------

## 2013-05-30 NOTE — Telephone Encounter (Signed)
Spoke with patient to review lab results.  Informed patient to take potassium supplement 20 mEq 3 x per day today, tomorrow, and Saturday and then to take 20 mEq BID.  Patient scheduled for BMET recheck 8/19.  Patient verbalized understanding and asked for new Rx to [facility] as she states she only has 5 pills left.  Rx sent and lab ordered in [facility]

## 2013-05-31 ENCOUNTER — Telehealth: Payer: Self-pay | Admitting: *Deleted

## 2013-05-31 NOTE — Telephone Encounter (Signed)
S/w [facility] pharmacy wanted to know if pt wanted tablets or a packet for potassium I stated tablets which was clearly on the script I recieved

## 2013-06-04 ENCOUNTER — Telehealth: Payer: Self-pay | Admitting: Nurse Practitioner

## 2013-06-04 ENCOUNTER — Other Ambulatory Visit: Payer: Managed Care, Other (non HMO)

## 2013-06-04 DIAGNOSIS — I10 Essential (primary) hypertension: Secondary | ICD-10-CM

## 2013-06-04 NOTE — Telephone Encounter (Signed)
New problem  Pt states she was told to go straight to the lab and get her bloodwork, but when she came in at 7:30 and was advised to register she became a little irritated at the wait time and left as she was told that she would be in and out. Pt request a call back.

## 2013-06-04 NOTE — Telephone Encounter (Signed)
Returned call to patient she stated she came in this morning at 7:30 am for lab work and she had to wait.Stated she had to leave due to having to be at work.Stated she was upset that she had to wait.Stated she would come back Thursday 06/06/13 to have lab done.

## 2013-06-06 ENCOUNTER — Other Ambulatory Visit (INDEPENDENT_AMBULATORY_CARE_PROVIDER_SITE_OTHER): Payer: Managed Care, Other (non HMO)

## 2013-06-06 DIAGNOSIS — I10 Essential (primary) hypertension: Secondary | ICD-10-CM

## 2013-06-06 LAB — BASIC METABOLIC PANEL
BUN: 12 mg/dL (ref 6–23)
CO2: 29 mEq/L (ref 19–32)
Calcium: 9.1 mg/dL (ref 8.4–10.5)
Chloride: 102 mEq/L (ref 96–112)
Creatinine, Ser: 0.9 mg/dL (ref 0.4–1.2)
GFR: 69.13 mL/min (ref >60.00)
Glucose, Bld: 97 mg/dL (ref 70–99)
Potassium: 3.7 mEq/L (ref 3.5–5.1)
Sodium: 136 mEq/L (ref 135–145)

## 2013-06-07 ENCOUNTER — Other Ambulatory Visit: Payer: Self-pay | Admitting: *Deleted

## 2013-06-07 DIAGNOSIS — E876 Hypokalemia: Secondary | ICD-10-CM

## 2013-06-07 MED ORDER — POTASSIUM CHLORIDE 20 MEQ PO PACK: 20.0000 meq | PACK | Freq: Two times a day (BID) | ORAL | Status: DC

## 2013-06-07 MED ORDER — POTASSIUM CHLORIDE CRYS ER 20 MEQ PO TBCR
20.0000 meq | EXTENDED_RELEASE_TABLET | Freq: Two times a day (BID) | ORAL | Status: DC
Start: 2013-06-07 — End: 2013-12-14

## 2013-06-17 HISTORY — PX: INCISION AND DRAINAGE ABSCESS: SHX5864

## 2013-07-05 ENCOUNTER — Other Ambulatory Visit (INDEPENDENT_AMBULATORY_CARE_PROVIDER_SITE_OTHER): Payer: Managed Care, Other (non HMO)

## 2013-07-05 DIAGNOSIS — E876 Hypokalemia: Secondary | ICD-10-CM

## 2013-07-05 LAB — BASIC METABOLIC PANEL
BUN: 7 mg/dL (ref 6–23)
CO2: 27 mEq/L (ref 19–32)
Calcium: 9.2 mg/dL (ref 8.4–10.5)
Chloride: 100 mEq/L (ref 96–112)
Creatinine, Ser: 1.2 mg/dL (ref 0.4–1.2)
GFR: 53.55 mL/min — ABNORMAL LOW (ref >60.00)
Glucose, Bld: 79 mg/dL (ref 70–99)
Potassium: 4 mEq/L (ref 3.5–5.1)
Sodium: 135 mEq/L (ref 135–145)

## 2013-08-17 DIAGNOSIS — J45909 Unspecified asthma, uncomplicated: Secondary | ICD-10-CM

## 2013-08-17 HISTORY — DX: Unspecified asthma, uncomplicated: J45.909

## 2013-08-22 ENCOUNTER — Other Ambulatory Visit: Payer: Self-pay

## 2013-10-28 ENCOUNTER — Encounter: Payer: Self-pay | Admitting: Family Medicine

## 2013-10-28 ENCOUNTER — Ambulatory Visit (INDEPENDENT_AMBULATORY_CARE_PROVIDER_SITE_OTHER): Payer: Managed Care, Other (non HMO) | Admitting: Family Medicine

## 2013-10-28 VITALS — BP 143/97 | HR 72 | Temp 98.6°F | Ht 67.0 in | Wt 219.0 lb

## 2013-10-28 DIAGNOSIS — J209 Acute bronchitis, unspecified: Secondary | ICD-10-CM

## 2013-10-28 MED ORDER — ACETAMINOPHEN-CODEINE #3 300-30 MG PO TABS: 1.0000 | ORAL_TABLET | Freq: Three times a day (TID) | ORAL | Status: DC | PRN

## 2013-10-28 NOTE — Patient Instructions (Signed)
Ms Deretha EmoryChambers, I am so sorry to hear that you are not feeling well! It sounds like you have a respiratory viral infection. I am reassured by your physical exam today and the negative chest xray that you had in December. I recommend you take tylenol #3 for the next 7-10days every 8hours as needed. If your fevers continue please call our office right away. Please be aware that this medication can tend to make you sleepy Drink plenty of fluids and get plenty of rest. We will see you back as needed  Feel better soon! Charlane FerrettiMelanie C Dominica Kent, MD

## 2013-10-28 NOTE — Progress Notes (Signed)
Patient ID: Ashley GurneySonja E Chambers, female   DOB: 1966-10-05, 48 y.o.   MRN: [id number] [facility] Charlane FerrettiMelanie C Glorious Flicker, MD Phone: [phone]  Subjective:  48 y.o previously well woman with no sig hx of interstitial lung dx or smoking hx presenting for persistence of coughing discomfort and increased pressure in face  # cough, back pain -Pt endorsing 4 weeks + of persistent cough wheezing and sore throat. Per pt report [facility] [place] around Dunse[place]mas time where she was told that she had acute bronchitis (with a neg cxr and nml labs) and was given a zpack, cough suppressant and proair. Since that time coughing improved for approximately a weeks time. However pt reporting many people coughing at work and has to work in Pharmacologistcubicle/tight space with minimal air flow.  -Today is attesting to cough, minimally relieved by hycodan and inhaler use (has used approx 2 times over the weekend). Major complaint is associated back spasms and sore throat. Pt has been taking ibuprofen prn for pain as well as fever that she noted last night to be 101.1    -Also had dental appointment on Saturday where she received 2 fillings. -denies myalgias, diarrhea, constipation or abdominal pain or rash  All systems were reviewed and were negative unless otherwise noted in the HPI  Past Medical History Patient Active Problem List   Diagnosis Date Noted  . HTN (hypertension) 03/30/2012  . Well adult exam 03/10/2011   Reviewed problem list.  Medications- reviewed and updated Chief complaint-noted No additions to family history Social history- patient is a never smoker  Objective: BP 143/97  Pulse 72  Temp(Src) 98.6 F (37 C) (Oral)  Ht 5\' 7"  (1.702 m)  Wt 219 lb (99.338 kg)  BMI 34.29 kg/m2  SpO2 97%  LMP 10/21/2013 Gen: NAD, alert, cooperative with exam, tearful HEENT: NCAT, EOMI, PERRL, TMs nml (non erythematous, non bulging) Neck: FROM, supple, no evidence of lymphadenopathy Resp: CTABL,  no wheezes, non-labored, no areas of consolidation  Neuro: Alert and oriented, No gross deficits Skin: no rashes no lesions  Assessment/Plan: See problem based a/p

## 2013-10-28 NOTE — Assessment & Plan Note (Signed)
A: prev dx of bronchitis at out of town UC; probable return of viral bronchitis vs continuation of sx; reporting elevated temps at home but exam consistent with viral presentation vs bacterial; possible overlying sinusitis P: tylenol #3 with codeine, instructed pt on reasons to rtc; expect prolonged recovery

## 2013-11-26 ENCOUNTER — Telehealth: Payer: Self-pay | Admitting: Family Medicine

## 2013-11-26 NOTE — Telephone Encounter (Signed)
Spoke with Dr. Jennette KettleNeal about this request. If patient is still having issues with her "chronic bronchial problems" she will need to have her pulmongisit fill the FMLA papers out

## 2013-11-26 NOTE — Telephone Encounter (Signed)
Returning patient's call back to me.

## 2013-11-26 NOTE — Telephone Encounter (Signed)
Pt called because she is still having episodes with her chronic bronchial problems. She has two appointments coming up, one with the cardiologist and a pulmonologist. She would like the findings to be sent to Dr. Jennette KettleNeal so that she can do FMLA papers. She wanted to know can Dr. Jennette KettleNeal just fill them out or does she need to make an appointment to fill out and discuss. jw

## 2013-11-27 ENCOUNTER — Encounter: Payer: Self-pay | Admitting: Cardiovascular Disease

## 2013-11-27 ENCOUNTER — Ambulatory Visit (INDEPENDENT_AMBULATORY_CARE_PROVIDER_SITE_OTHER): Payer: Managed Care, Other (non HMO) | Admitting: Cardiovascular Disease

## 2013-11-27 VITALS — BP 142/84 | HR 77 | Ht 67.0 in | Wt 215.0 lb

## 2013-11-27 DIAGNOSIS — E876 Hypokalemia: Secondary | ICD-10-CM

## 2013-11-27 DIAGNOSIS — I10 Essential (primary) hypertension: Secondary | ICD-10-CM

## 2013-11-27 NOTE — Telephone Encounter (Signed)
Patient has scheduled appointment to see her pulmonologist to complete FMLA forms.patient spoke to [place] after I left Dr Donnetta HailNeal's message on voicemail. Donicia Druck, Virgel BouquetGiovanna S

## 2013-11-27 NOTE — Progress Notes (Addendum)
Ashley GurneySonja E Schneider Date of Birth: 29-Apr-1966 Medical Record #[id number]  History of Present Illness: [name] is seen back today  For follow up of her HTN.   Has HTN and obesity. No known CAD.   Last seen last year for med refill.   Comes back today.  Doing ok. Has gained some weight. Just getting back into an exercise routine - did a 10K earlier this year and hurt her right ankle.  Feb. 11, 2015:  She has had many noncardiac issues that have caused her to have a rough year.  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   She has one area beneath her bra that is still not totally healed.  She has had lots of symptoms of bronchitis.   Her BP has been mildly elevated.   She denies any chest pain or dyspnea.  She thinks that her work environment is causing her to be sick.   Current Outpatient Prescriptions  Medication Sig Dispense Refill  . acetaminophen-codeine (TYLENOL #3) 300-30 MG per tablet Take 1-2 tablets by mouth every 8 (eight) hours as needed for moderate pain or severe pain.  30 tablet  0  . albuterol (PROVENTIL HFA;VENTOLIN HFA) 108 (90 BASE) MCG/ACT inhaler Inhale into the lungs every 6 (six) hours as needed for wheezing or shortness of breath.      Marland Kitchen. BIOTIN PO Take by mouth.      Marland Kitchen. guaiFENesin-codeine (ROBITUSSIN AC) 100-10 MG/5ML syrup Take 5 mLs by mouth 3 (three) times daily as needed for cough.      . hydrochlorothiazide (HYDRODIURIL) 25 MG tablet Take 1 tablet (25 mg total) by mouth daily.  90 tablet  3  . Multiple Vitamin (MULTIVITAMIN) tablet Take 1 tablet by mouth daily.      . potassium chloride SA (K-DUR,KLOR-CON) 20 MEQ tablet Take 1 tablet (20 mEq total) by mouth 2 (two) times daily.  60 tablet  3   No current facility-administered medications for this visit.    Allergies  Allergen Reactions  . Penicillins     Past Medical History  Diagnosis Date  . Hypertension   . Pregnancy induced  hypertension 1995  . Obesity     Past Surgical History  Procedure Laterality Date  . Tubal ligation    . Endometrial ablation  2009    History  Smoking status  . Never Smoker   Smokeless tobacco  . Never Used    History  Alcohol Use No    Family History  Problem Relation Age of Onset  . Hypertension Mother   . Diabetic kidney disease Father     Review of Systems: The review of systems is per the HPI.  All other systems were reviewed and are negative.  Physical Exam: BP 142/84  Pulse 77  Ht 5\' 7"  (1.702 m)  Wt 215 lb (97.523 kg)  BMI 33.67 kg/m2  LMP 10/21/2013 Patient is very pleasant and in no acute distress. She remains obese. Skin is warm and dry. Color is normal.  HEENT is unremarkable. Normocephalic/atraumatic. PERRL. Sclera are nonicteric. Neck is supple. No masses. No JVD. Lungs are clear. Cardiac exam shows a regular rate and rhythm. Abdomen is soft. Extremities are without edema. Gait and ROM are intact. No gross neurologic deficits noted.  LABORATORY DATA:  Lab Results  Component Value Date   WBC 5.0 08/17/2012   HGB 13.1 08/17/2012   HCT 37.1 08/17/2012   PLT  316 08/17/2012   GLUCOSE 79 07/05/2013   CHOL 161 08/17/2012   TRIG 152* 08/17/2012   HDL 32* 08/17/2012   LDLCALC 99 08/17/2012   ALT 12 08/17/2012   AST 22 08/17/2012   NA 135 07/05/2013   K 4.0 07/05/2013   CL 100 07/05/2013   CREATININE 1.2 07/05/2013   BUN 7 07/05/2013   CO2 27 07/05/2013   TSH 0.443 08/17/2012   ECG: 11/27/2013: Normal sinus rhythm at 77. The EKG is  normal  Assessment / Plan:

## 2013-11-27 NOTE — Patient Instructions (Signed)
Your physician wants you to follow-up in: ONE YEAR WITH DR NAHSER You will receive a reminder letter in the mail two months in advance. If you don't receive a letter, please call our office to schedule the follow-up appointment.  

## 2013-11-27 NOTE — Assessment & Plan Note (Signed)
Her BP is a bit elevated today but overall is acceptable.  She has not been exercising at all because of her bronchitis.  I have encouraged her to get back into her exercise regimine.    I have encouraged her to work on her diet and weight loss plan as soon as she is feeling better.

## 2013-11-28 ENCOUNTER — Ambulatory Visit (INDEPENDENT_AMBULATORY_CARE_PROVIDER_SITE_OTHER): Payer: Managed Care, Other (non HMO) | Admitting: Internal Medicine

## 2013-11-28 ENCOUNTER — Encounter: Payer: Self-pay | Admitting: Internal Medicine

## 2013-11-28 VITALS — BP 154/100 | HR 78 | Temp 98.1°F | Ht 67.0 in | Wt 215.0 lb

## 2013-11-28 DIAGNOSIS — R0609 Other forms of dyspnea: Secondary | ICD-10-CM

## 2013-11-28 DIAGNOSIS — R059 Cough, unspecified: Secondary | ICD-10-CM

## 2013-11-28 DIAGNOSIS — R058 Other specified cough: Secondary | ICD-10-CM | POA: Insufficient documentation

## 2013-11-28 DIAGNOSIS — R0989 Other specified symptoms and signs involving the circulatory and respiratory systems: Secondary | ICD-10-CM

## 2013-11-28 DIAGNOSIS — R05 Cough: Secondary | ICD-10-CM

## 2013-11-28 DIAGNOSIS — R0600 Dyspnea, unspecified: Secondary | ICD-10-CM

## 2013-11-28 MED ORDER — FAMOTIDINE 20 MG PO TABS: ORAL_TABLET | ORAL | Status: DC

## 2013-11-28 MED ORDER — PREDNISONE 10 MG PO TABS: ORAL_TABLET | ORAL | Status: DC

## 2013-11-28 MED ORDER — TRAMADOL HCL 50 MG PO TABS: ORAL_TABLET | ORAL | Status: DC

## 2013-11-28 MED ORDER — PANTOPRAZOLE SODIUM 40 MG PO TBEC: 40.0000 mg | DELAYED_RELEASE_TABLET | Freq: Every day | ORAL | Status: DC

## 2013-11-28 NOTE — Assessment & Plan Note (Signed)
The most common causes of chronic cough in immunocompetent adults include the following: upper airway cough syndrome (UACS), previously referred to as postnasal drip syndrome (PNDS), which is caused by variety of rhinosinus conditions; (2) asthma; (3) GERD; (4) chronic bronchitis from cigarette smoking or other inhaled environmental irritants; (5) nonasthmatic eosinophilic bronchitis; and (6) bronchiectasis.   These conditions, singly or in combination, have accounted for up to 94% of the causes of chronic cough in prospective studies.   Other conditions have constituted no >6% of the causes in prospective studies These have included bronchogenic carcinoma, chronic interstitial pneumonia, sarcoidosis, left ventricular failure, ACEI-induced cough, and aspiration from a condition associated with pharyngeal dysfunction.    Chronic cough is often simultaneously caused by more than one condition. A single cause has been found from 38 to 82% of the time, multiple causes from 18 to 62%. Multiply caused cough has been the result of three diseases up to 42% of the time.       Most likely this is  Classic Upper airway cough syndrome, so named because it's frequently impossible to sort out how much is  CR/sinusitis with freq throat clearing (which can be related to primary GERD)   vs  causing  secondary (" extra esophageal")  GERD from wide swings in gastric pressure that occur with throat clearing, often  promoting self use of mint and menthol lozenges that reduce the lower esophageal sphincter tone and exacerbate the problem further in a cyclical fashion.   These are the same pts (now being labeled as having "irritable larynx syndrome" by some cough centers) who not infrequently have a history of having failed to tolerate ace inhibitors,  dry powder inhalers or biphosphonates or report having atypical reflux symptoms that don't respond to standard doses of PPI , and are easily confused as having aecopd or asthma  flares by even experienced allergists/ pulmonologists.  rc sinus ct/ cyclical cough regimen and regroup in 4 weeks

## 2013-11-28 NOTE — Progress Notes (Signed)
   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 HS needed inhaler for sports then around mid 90's required er eval for ? Asthma got better and stayed gone years until Oct 2014 with onset of chest tightness/ cough worse when lie down [place] started on albuterol/zpak/cough medication but never better so self referred 11/28/2013 to pulmonary clinic.  11/28/2013 1st [facility] office visit/ Carson Meche cc daily symptoms worse at bedtime of cough and green mucus - tightness is some better with albuterol but not with pred rx. Almost feels like  Choking when lies down at night. Worse with cold air exp/aerosol sprays  No obvious other  day patterns in  day or daytime variabilty or assoc chronic cough or cp or chest tightness, subjective wheeze overt   hb symptoms. No unusual exp hx or h/o childhood pna/ asthma or knowledge of premature birth.   . Also denies any obvious fluctuation of symptoms with weather or environmental changes or other aggravating or alleviating factors except as outlined above   Current Medications, Allergies, Complete Past Medical History, Past Surgical History, Family History, and Social History were reviewed in [facility] electronic medical record.          Review of Systems  Constitutional: Negative for fever, chills and unexpected weight change.  HENT: Positive for congestion and sneezing. Negative for dental problem, ear pain, nosebleeds, postnasal drip, rhinorrhea, sinus pressure, sore throat, trouble swallowing and voice change.   Eyes: Negative for visual disturbance.  Respiratory: Positive for cough and shortness of breath. Negative for choking.   Cardiovascular: Negative for chest pain and leg swelling.  Gastrointestinal: Negative for vomiting, abdominal pain and diarrhea.  Genitourinary: Negative for difficulty urinating.   Musculoskeletal: Negative for arthralgias.  Skin: Negative for rash.  Neurological: Positive for headaches. Negative for tremors and syncope.  Hematological: Does not bruise/bleed easily.       Objective:   Physical Exam   amb bf nad ? Belle affect  Wt Readings from Last 3 Encounters:  11/28/13 215 lb (97.523 kg)  11/27/13 215 lb (97.523 kg)  10/28/13 219 lb (99.338 kg)     HEENT: nl dentition, turbinates, and orophanx. Nl external ear canals without cough reflex   NECK :  without JVD/Nodes/TM/ nl carotid upstrokes bilaterally   LUNGS: no acc muscle use, clear to A and P bilaterally without cough on insp or exp maneuvers   CV:  RRR  no s3 or murmur or increase in P2, no edema   ABD:  soft and nontender with nl excursion in the supine position. No bruits or organomegaly, bowel sounds nl  MS:  warm without deformities, calf tenderness, cyanosis or clubbing  SKIN: warm and dry without lesions    NEURO:  alert, approp, no deficits    Multiple nl cxr's per pt since onset of illness not avail at ov   Spirometry 11/28/2013 wnl in effort indep portion     Assessment & Plan:

## 2013-11-28 NOTE — Patient Instructions (Addendum)
The key to effective treatment for your cough is eliminating the non-stop cycle of cough you're stuck in long enough to let your airway heal completely and then see if there is anything still making you cough once you stop the cough suppression, but this should take no more than 5 days to figure out  First take delsym two tsp every 12 hours and supplement if needed with  tramadol 50 mg up to 2 every 4 hours to suppress the urge to cough at all or even clear your throat. Swallowing water or using ice chips/non mint and menthol containing candies (such as lifesavers or sugarless jolly ranchers) are also effective.  You should rest your voice and avoid activities that you know make you cough.  Once you have eliminated the cough for 3 straight days try reducing the tramadol first,  then the delsym as tolerated.    Pantoprazole (protonix) 40 mg   Take 30-60 min before first meal of the day and Pepcid 20 mg one bedtime and chlortrimeton 4 mg at bedtime until return to office - this is the best way to tell whether stomach acid is contributing to your problem.    GERD (REFLUX)  is an extremely common cause of respiratory symptoms, many times with no significant heartburn at all.    It can be treated with medication, but also with lifestyle changes including avoidance of late meals, excessive alcohol, smoking cessation, and avoid fatty foods, chocolate, peppermint, colas, red wine, and acidic juices such as orange juice.  NO MINT OR MENTHOL PRODUCTS SO NO COUGH DROPS  USE SUGARLESS CANDY INSTEAD (jolley ranchers or Stover's)  NO OIL BASED VITAMINS - use powdered substitutes.  Please see patient coordinator before you leave today  to schedule sinus CT   Please schedule a follow up office visit in 2 weeks, sooner if needed  Late add Bring all meds

## 2013-12-02 ENCOUNTER — Telehealth: Payer: Self-pay | Admitting: Internal Medicine

## 2013-12-02 MED ORDER — AMOXICILLIN-POT CLAVULANATE 875-125 MG PO TABS: 1.0000 | ORAL_TABLET | Freq: Two times a day (BID) | ORAL | Status: DC

## 2013-12-02 NOTE — Telephone Encounter (Signed)
Spoke with pt and advised of Dr Thurston HoleWert's recommendations - Augmentin rx sent to pharmacy - PT has plenty tylenol # 3 for now - Pt to call our office if symptoms do not improve

## 2013-12-02 NOTE — Telephone Encounter (Signed)
Per last OV: The key to effective treatment for your cough is eliminating the non-stop cycle of cough you're stuck in long enough to let your airway heal completely and then see if there is anything still making you cough once you stop the cough suppression, but this should take no more than 5 days to figure out First take delsym two tsp every 12 hours and supplement if needed with  tramadol 50 mg up to 2 every 4 hours to suppress the urge to cough at all or even clear your throat. Swallowing water or using ice chips/non mint and menthol containing candies (such as lifesavers or sugarless jolly ranchers) are also effective.  You should rest your voice and avoid activities that you know make you cough. Once you have eliminated the cough for 3 straight days try reducing the tramadol first,  then the delsym as tolerated.   Pantoprazole (protonix) 40 mg   Take 30-60 min before first meal of the day and Pepcid 20 mg one bedtime and chlortrimeton 4 mg at bedtime until return to office - this is the best way to tell whether stomach acid is contributing to your problem.   GERD (REFLUX)  is an extremely common cause of respiratory symptoms, many times with no significant heartburn at all.   It can be treated with medication, but also with lifestyle changes including avoidance of late meals, excessive alcohol, smoking cessation, and avoid fatty foods, chocolate, peppermint, colas, red wine, and acidic juices such as orange juice.   NO MINT OR MENTHOL PRODUCTS SO NO COUGH DROPS  USE SUGARLESS CANDY INSTEAD (jolley ranchers or Stover's)   NO OIL BASED VITAMINS - use powdered substitutes. Please see patient coordinator before you leave today  to schedule sinus CT  ----  Pt reports one of her m,edications is making her lightheaded and shaky. She did voice rest x 3 days and is still coughing d/t PND. She is scheduled for sinus CT Friday d/t weather. She has been doing exactly everything MW stated for her to and how he  wants her too with all her new medications. Please advise thanks

## 2013-12-02 NOTE — Telephone Encounter (Signed)
i see Tylenol #3 on her list of meds would try this in place of tramadol to see if helps the cough without making her too woozy but dose is one every 4 h (ok to fax in #40 if needs) and go ahead and take  Augmentin 875 mg take one pill twice daily  X 10 days - take at breakfast and supper with large glass of water.  It would help reduce the usual side effects (diarrhea and yeast infections) if you ate cultured yogurt at lunch.

## 2013-12-03 ENCOUNTER — Other Ambulatory Visit: Payer: Managed Care, Other (non HMO)

## 2013-12-05 ENCOUNTER — Telehealth: Payer: Self-pay | Admitting: Internal Medicine

## 2013-12-05 NOTE — Telephone Encounter (Signed)
We need to clarify the drug allergy list to add "penicillin"  Stop the augmentin antibiotic.   If she is still acvtively uncomfortable about her breathing or rash, then see if someone can see her here, or direct her to an urgent care or her primary doctor.

## 2013-12-05 NOTE — Telephone Encounter (Signed)
Called spoke with patient who stated that she began the Augmentin yesterday evening and developed welts, hives and itching last night.  Pt stated that her son slept next to her and reported that she was "breathing funny".  She is having chest tightness.  Pt denies her throat closing, increased SOB.  She has been taking Benadryl every 4 hours since her symptoms began.  It is documented that pt is allergic to "all cillins" MW is not in the office today, will forward to doc of the day Dr Maple HudsonYoung please advise, thank you. Allergies  Allergen Reactions  . Other     ALLERGIC TO "ALL CILLINS"-CAUSES HIVES    Per 2.16.15 phone note:  Nyoka CowdenMichael B Wert, MD at 12/02/2013 10:17 AM      Status: Signed            i see Tylenol #3 on her list of meds would try this in place of tramadol to see if helps the cough without making her too woozy but dose is one every 4 h (ok to fax in #40 if needs) and go ahead and take   Augmentin 875 mg take one pill twice daily  X 10 days - take at breakfast and supper with large glass of water.  It would help reduce the usual side effects (diarrhea and yeast infections) if you ate cultured yogurt at lunch.        Tommie SamsMindy S Silva, CMA at 12/02/2013  9:12 AM      Status: Signed            Per last OV: The key to effective treatment for your cough is eliminating the non-stop cycle of cough you're stuck in long enough to let your airway heal completely and then see if there is anything still making you cough once you stop the cough suppression, but this should take no more than 5 days to figure out First take delsym two tsp every 12 hours and supplement if needed with  tramadol 50 mg up to 2 every 4 hours to suppress the urge to cough at all or even clear your throat. Swallowing water or using ice chips/non mint and menthol containing candies (such as lifesavers or sugarless jolly ranchers) are also effective.  You should rest your voice and avoid activities that you know make you  cough. Once you have eliminated the cough for 3 straight days try reducing the tramadol first,  then the delsym as tolerated.    Pantoprazole (protonix) 40 mg   Take 30-60 min before first meal of the day and Pepcid 20 mg one bedtime and chlortrimeton 4 mg at bedtime until return to office - this is the best way to tell whether stomach acid is contributing to your problem.    GERD (REFLUX)  is an extremely common cause of respiratory symptoms, many times with no significant heartburn at all.    It can be treated with medication, but also with lifestyle changes including avoidance of late meals, excessive alcohol, smoking cessation, and avoid fatty foods, chocolate, peppermint, colas, red wine, and acidic juices such as orange juice.   NO MINT OR MENTHOL PRODUCTS SO NO COUGH DROPS   USE SUGARLESS CANDY INSTEAD (jolley ranchers or Stover's)   NO OIL BASED VITAMINS - use powdered substitutes. Please see patient coordinator before you leave today  to schedule sinus CT   ---- Pt reports one of her m,edications is making her lightheaded and shaky. She did voice rest x 3 days  and is still coughing d/t PND. She is scheduled for sinus CT Friday d/t weather. She has been doing exactly everything MW stated for her to and how he wants her too with all her new medications. Please advise thanks

## 2013-12-05 NOTE — Telephone Encounter (Signed)
Pt aware of recs. Allergy added She did not feel she needed appt.

## 2013-12-06 ENCOUNTER — Ambulatory Visit (INDEPENDENT_AMBULATORY_CARE_PROVIDER_SITE_OTHER)
Admission: RE | Admit: 2013-12-06 | Discharge: 2013-12-06 | Disposition: A | Discharge status: Home / Self Care | Payer: Managed Care, Other (non HMO) | Source: Ambulatory Visit | Attending: Internal Medicine | Admitting: Internal Medicine

## 2013-12-06 ENCOUNTER — Encounter: Payer: Self-pay | Admitting: Internal Medicine

## 2013-12-06 DIAGNOSIS — R05 Cough: Secondary | ICD-10-CM

## 2013-12-06 DIAGNOSIS — R059 Cough, unspecified: Secondary | ICD-10-CM

## 2013-12-06 NOTE — Progress Notes (Signed)
Quick Note:  Spoke with pt and notified of results per Dr. Wert. Pt verbalized understanding and denied any questions.  ______ 

## 2013-12-10 ENCOUNTER — Telehealth: Payer: Self-pay | Admitting: Internal Medicine

## 2013-12-10 NOTE — Telephone Encounter (Signed)
Called the number that was provided. It rings once and then the line does nothing.

## 2013-12-11 NOTE — Telephone Encounter (Signed)
ATC the # proivded. It rang twice then nothing. Retried calling again same thing happened. No options giving. Will sign off message and await a call back

## 2013-12-14 ENCOUNTER — Encounter (HOSPITAL_COMMUNITY): Payer: Self-pay | Admitting: Emergency Medicine

## 2013-12-14 ENCOUNTER — Emergency Department (HOSPITAL_COMMUNITY)
Admission: EM | Admit: 2013-12-14 | Discharge: 2013-12-15 | Disposition: A | Discharge status: Home / Self Care | Payer: Managed Care, Other (non HMO) | Attending: Emergency Medicine | Admitting: Emergency Medicine

## 2013-12-14 ENCOUNTER — Emergency Department (HOSPITAL_COMMUNITY): Payer: Managed Care, Other (non HMO)

## 2013-12-14 DIAGNOSIS — Z79899 Other long term (current) drug therapy: Secondary | ICD-10-CM | POA: Insufficient documentation

## 2013-12-14 DIAGNOSIS — J45901 Unspecified asthma with (acute) exacerbation: Secondary | ICD-10-CM | POA: Insufficient documentation

## 2013-12-14 DIAGNOSIS — E669 Obesity, unspecified: Secondary | ICD-10-CM | POA: Insufficient documentation

## 2013-12-14 DIAGNOSIS — Z880 Allergy status to penicillin: Secondary | ICD-10-CM | POA: Insufficient documentation

## 2013-12-14 DIAGNOSIS — I10 Essential (primary) hypertension: Secondary | ICD-10-CM | POA: Insufficient documentation

## 2013-12-14 LAB — CBC
HCT: 39.7 % (ref 36.0–46.0)
Hemoglobin: 14.5 g/dL (ref 12.0–15.0)
MCH: 31.5 pg (ref 26.0–34.0)
MCHC: 36.5 g/dL — AB (ref 30.0–36.0)
MCV: 86.1 fL (ref 78.0–100.0)
Platelets: 317 10*3/uL (ref 150–400)
RBC: 4.61 MIL/uL (ref 3.87–5.11)
RDW: 12.8 % (ref 11.5–15.5)
WBC: 8.3 10*3/uL (ref 4.0–10.5)

## 2013-12-14 LAB — BASIC METABOLIC PANEL
BUN: 7 mg/dL (ref 6–23)
CO2: 29 mEq/L (ref 19–32)
Calcium: 9.7 mg/dL (ref 8.4–10.5)
Chloride: 100 mEq/L (ref 96–112)
Creatinine, Ser: 0.85 mg/dL (ref 0.50–1.10)
GFR, EST NON AFRICAN AMERICAN: 81 mL/min — AB (ref >90)
Glucose, Bld: 113 mg/dL — ABNORMAL HIGH (ref 70–99)
POTASSIUM: 3.3 meq/L — AB (ref 3.7–5.3)
SODIUM: 144 meq/L (ref 137–147)

## 2013-12-14 LAB — PRO B NATRIURETIC PEPTIDE: Pro B Natriuretic peptide (BNP): 51.7 pg/mL (ref 0–125)

## 2013-12-14 MED ORDER — ALBUTEROL (5 MG/ML) CONTINUOUS INHALATION SOLN
10.0000 mg/h | INHALATION_SOLUTION | Freq: Once | RESPIRATORY_TRACT | Status: AC
  Administered 2013-12-14: 10 mg/h via RESPIRATORY_TRACT
  Filled 2013-12-14: qty 20

## 2013-12-14 MED ORDER — IPRATROPIUM BROMIDE 0.02 % IN SOLN
0.5000 mg | Freq: Once | RESPIRATORY_TRACT | Status: AC
  Administered 2013-12-14: 0.5 mg via RESPIRATORY_TRACT
  Filled 2013-12-14: qty 2.5

## 2013-12-14 MED ORDER — METHYLPREDNISOLONE SODIUM SUCC 125 MG IJ SOLR
125.0000 mg | Freq: Four times a day (QID) | INTRAMUSCULAR | Status: DC
  Administered 2013-12-14: 125 mg via INTRAVENOUS
  Filled 2013-12-14: qty 2

## 2013-12-14 NOTE — ED Provider Notes (Addendum)
CSN: [id number]     Arrival date & time 12/14/13  2139 History   First MD Initiated Contact with Patient 12/14/13 2212     Chief Complaint  Patient presents with  . Shortness of Breath  . Chest Pain    HPI Pt has been having trouble on and off since December.  She started having trouble with her breathing again today.  She was trying her inhaler without relief.  Pt has been coughing.  She has been having pain in her chest with coughing.  She had been on prednisone this month but has been off for at least a week.  No fevers.  No vomiting or diarrhea.  No leg swelling.  She has history of recurrent bronchospasm problems.  She is followed in the [facility] clinic. Past Medical History  Diagnosis Date  . Hypertension   . Pregnancy induced hypertension 1995  . Obesity   . Childhood asthma    Past Surgical History  Procedure Laterality Date  . Tubal ligation    . Endometrial ablation  2009   Family History  Problem Relation Age of Onset  . Hypertension Mother   . Diabetic kidney disease Father   . Prostate cancer Maternal Uncle   . Lung cancer Maternal Uncle    History  Substance Use Topics  . Smoking status: Never Smoker   . Smokeless tobacco: Never Used  . Alcohol Use: No   OB History   Grav Para Term Preterm Abortions TAB SAB Ect Mult Living                 Review of Systems  All other systems reviewed and are negative.      Allergies  Other and Penicillins  Home Medications   Current Outpatient Rx  Name  Route  Sig  Dispense  Refill  . albuterol (PROVENTIL HFA;VENTOLIN HFA) 108 (90 BASE) MCG/ACT inhaler   Inhalation   Inhale into the lungs every 6 (six) hours as needed for wheezing or shortness of breath.         Marland Kitchen BIOTIN PO   Oral   Take 1 tablet by mouth daily.          Marland Kitchen guaiFENesin-dextromethorphan (ROBITUSSIN DM) 100-10 MG/5ML syrup   Oral   Take 30 mLs by mouth every 8 (eight) hours as needed for cough.         . hydrochlorothiazide  (HYDRODIURIL) 25 MG tablet   Oral   Take 1 tablet (25 mg total) by mouth daily.   90 tablet   3     Pt needs appointment then refill can be made   . Multiple Vitamin (MULTIVITAMIN WITH MINERALS) TABS tablet   Oral   Take 1 tablet by mouth 2 (two) times a week. Take on Monday and Thursday         . potassium chloride SA (K-DUR,KLOR-CON) 20 MEQ tablet   Oral   Take 40 mEq by mouth daily.          BP 179/80  Pulse 105  Temp(Src) 98.9 F (37.2 C)  Resp 24  SpO2 95%  LMP 11/21/2013 Physical Exam  Nursing note and vitals reviewed. Constitutional: She appears well-developed and well-nourished. No distress.  HENT:  Head: Normocephalic and atraumatic.  Right Ear: External ear normal.  Left Ear: External ear normal.  Eyes: Conjunctivae are normal. Right eye exhibits no discharge. Left eye exhibits no discharge. No scleral icterus.  Neck: Neck supple. No tracheal deviation present.  Cardiovascular:  Normal rate, regular rhythm and intact distal pulses.   Pulmonary/Chest: Effort normal. No stridor. No respiratory distress. She has wheezes. She has no rales.  Abdominal: Soft. Bowel sounds are normal. She exhibits no distension. There is no tenderness. There is no rebound and no guarding.  Musculoskeletal: She exhibits no edema and no tenderness.  Neurological: She is alert. She has normal strength. No cranial nerve deficit (no facial droop, extraocular movements intact, no slurred speech) or sensory deficit. She exhibits normal muscle tone. She displays no seizure activity. Coordination normal.  Skin: Skin is warm and dry. No rash noted.  Psychiatric: She has a normal mood and affect.    ED Course  Procedures (including critical care time) Labs Review Labs Reviewed  CBC - Abnormal; Notable for the following:    MCHC 36.5 (*)    All other components within normal limits  BASIC METABOLIC PANEL - Abnormal; Notable for the following:    Potassium 3.3 (*)    Glucose, Bld 113 (*)     GFR calc non Af Amer 81 (*)    All other components within normal limits  PRO B NATRIURETIC PEPTIDE  I-STAT TROPOININ, ED   Imaging Review Dg Chest 2 View  12/14/2013   CLINICAL DATA:  Shortness of breath, and chest and upper back pain.  EXAM: CHEST  2 VIEW  COMPARISON:  None.  FINDINGS: The lungs are well-aerated and clear. There is no evidence of focal opacification, pleural effusion or pneumothorax.  The heart is normal in size; the mediastinal contour is within normal limits. No acute osseous abnormalities are seen.  IMPRESSION: No acute cardiopulmonary process seen.   Electronically Signed   By: Roanna RaiderJeffery  Chang M.D.   On: 12/14/2013 23:08     EKG Interpretation   Date/Time:  Saturday December 14 2013 21:47:05 EST Ventricular Rate:  95 PR Interval:  144 QRS Duration: 80 QT Interval:  360 QTC Calculation: 452 R Axis:   78 Text Interpretation:  Normal sinus rhythm Normal ECG Artifact No  significant change since last tracing Confirmed by Noell Lorensen  MD-J, Luann Aspinwall  ([id number]) on 12/14/2013 10:12:06 PM     Medications  methylPREDNISolone sodium succinate (SOLU-MEDROL) 125 mg/2 mL injection 125 mg (125 mg Intravenous Given 12/14/13 2257)  albuterol (PROVENTIL,VENTOLIN) solution continuous neb (10 mg/hr Nebulization Given 12/14/13 2300)  ipratropium (ATROVENT) nebulizer solution 0.5 mg (0.5 mg Nebulization Given 12/14/13 2300)    MDM  1200  Pt is receiving her breathing treatment.  She is not in any distress.  Still with some wheezing on exam.  Will need to monitor in the ED, and see if she responds to breathing treatments.  If not , she may require admission for an acute asthma exacerbation.    Celene KrasJon R Fortune Brannigan, MD 12/15/13 0003  1243am   Pt is feeling better and feels ready to go home.  She was just recently on a steroid taper.  Will start her back on steroids.  Follow up with her pulmonologist to taper off the dose.  Celene KrasJon R Derhonda Eastlick, MD 12/15/13 [phone]

## 2013-12-14 NOTE — ED Notes (Signed)
Pt states intermittent CP and SOB all day, states inhaler is not work CP radiates to and is described as sharp. Pain exacerbated by sitting back, Pain relived by tri-pod position. Pt speaking short 2-3 words at a time.

## 2013-12-15 MED ORDER — PREDNISONE 20 MG PO TABS: 60.0000 mg | ORAL_TABLET | Freq: Every day | ORAL | Status: DC

## 2013-12-15 MED ORDER — ALBUTEROL SULFATE HFA 108 (90 BASE) MCG/ACT IN AERS
1.0000 | INHALATION_SPRAY | RESPIRATORY_TRACT | Status: DC | PRN
  Administered 2013-12-15: 2 via RESPIRATORY_TRACT
  Filled 2013-12-15: qty 6.7

## 2013-12-15 NOTE — Progress Notes (Signed)
Albuterol nebulizer stopped at this time due to and increase in patients heart rate. Breath sounds improved with increased airflow

## 2013-12-15 NOTE — Progress Notes (Signed)
Patients heart rate 72-80 bpm at this time. Breath sounds clear

## 2013-12-15 NOTE — Discharge Instructions (Signed)

## 2013-12-15 NOTE — ED Notes (Signed)
Pt dc to home. Pt sts understanding to dc instructions. Pt ambulatory to exit without difficulty. nadn.

## 2013-12-16 ENCOUNTER — Encounter: Payer: Self-pay | Admitting: Nurse Practitioner

## 2013-12-17 ENCOUNTER — Ambulatory Visit (INDEPENDENT_AMBULATORY_CARE_PROVIDER_SITE_OTHER): Payer: Managed Care, Other (non HMO) | Admitting: Nurse Practitioner

## 2013-12-17 ENCOUNTER — Telehealth: Payer: Self-pay | Admitting: Internal Medicine

## 2013-12-17 ENCOUNTER — Encounter: Payer: Self-pay | Admitting: Nurse Practitioner

## 2013-12-17 ENCOUNTER — Ambulatory Visit: Payer: Self-pay | Admitting: Nurse Practitioner

## 2013-12-17 VITALS — BP 140/92 | HR 60 | Ht 65.25 in | Wt 214.0 lb

## 2013-12-17 DIAGNOSIS — Z01419 Encounter for gynecological examination (general) (routine) without abnormal findings: Secondary | ICD-10-CM

## 2013-12-17 NOTE — Telephone Encounter (Signed)
Pt aware that per Verlon AuLeslie, Dr [facility], this has been sent down to to Medical Records.  Pt states that her papers are due tomorrow--I explained to her that we just received this on the fax machine this morning and that it has been sent down to be completed.  Pt going to contact Disability Agent and let them know that we just received this and that it will not be completed by tomorrow.  Will send back to [place] to follow up on.

## 2013-12-17 NOTE — Patient Instructions (Signed)

## 2013-12-17 NOTE — Progress Notes (Signed)
Patient ID: Ashley Schneider, female   DOB: [birth year], 48 y.o.   MRN: [id number] 48 y.o. [id number] Single African American Fe here for annual exam.  Menses last X 1 day since ablation in 2009.  Some breast tenderness.  Not dating or SA.  Patient's last menstrual period was 12/09/2013.          Sexually active: no  The current method of family planning is abstinence and ablation, BTL    Exercising: no  Exercise is limited by respiratory condition(s): asthma. Smoker:  no  Health Maintenance: Pap:  12/12/11, WNL, neg HR HPV MMG:  01/08/13, U/S Bi-Rads 2: benign findings, repeat in one year TDaP:  11/18/10 Labs:  12/14/13 in ER, declined urine   reports that she has never smoked. She has never used smokeless tobacco. She reports that she does not drink alcohol or use illicit drugs.  Past Medical History  Diagnosis Date  . Hypertension   . Pregnancy induced hypertension 1995  . Obesity   . Childhood asthma     Past Surgical History  Procedure Laterality Date  . Tubal ligation    . Endometrial ablation  2009    Current Outpatient Prescriptions  Medication Sig Dispense Refill  . albuterol (PROVENTIL HFA;VENTOLIN HFA) 108 (90 BASE) MCG/ACT inhaler Inhale into the lungs every 6 (six) hours as needed for wheezing or shortness of breath.      Marland Kitchen BIOTIN PO Take 1 tablet by mouth daily.       Marland Kitchen guaiFENesin-dextromethorphan (ROBITUSSIN DM) 100-10 MG/5ML syrup Take 30 mLs by mouth every 8 (eight) hours as needed for cough.      . hydrochlorothiazide (HYDRODIURIL) 25 MG tablet Take 1 tablet (25 mg total) by mouth daily.  90 tablet  3  . Multiple Vitamin (MULTIVITAMIN WITH MINERALS) TABS tablet Take 1 tablet by mouth 2 (two) times a week. Take on Monday and Thursday      . potassium chloride SA (K-DUR,KLOR-CON) 20 MEQ tablet Take 40 mEq by mouth daily.      . predniSONE (DELTASONE) 20 MG tablet Take 3 tablets (60 mg total) by mouth daily.  30 tablet  0   No current facility-administered medications  for this visit.    Family History  Problem Relation Age of Onset  . Hypertension Mother   . Diabetic kidney disease Father   . Prostate cancer Maternal Uncle   . Lung cancer Maternal Uncle     ROS:  Pertinent items are noted in HPI.  Otherwise, a comprehensive ROS was negative.  Exam:   BP 140/92  Pulse 60  Ht 5' 5.25" (1.657 m)  Wt 214 lb (97.07 kg)  BMI 35.35 kg/m2  LMP 12/09/2013 Height: 5' 5.25" (165.7 cm)  Ht Readings from Last 3 Encounters:  12/17/13 5' 5.25" (1.657 m)  11/28/13 5\' 7"  (1.702 m)  11/27/13 5\' 7"  (1.702 m)    General appearance: alert, cooperative and appears stated age Head: Normocephalic, without obvious abnormality, atraumatic Neck: no adenopathy, supple, symmetrical, trachea midline and thyroid normal to inspection and palpation Lungs: clear to auscultation bilaterally Breasts: normal appearance, no masses or tenderness, recent abscess under left breast with I&D 06/2013. small scars are welll healed. Heart: regular rate and rhythm Abdomen: soft, non-tender; no masses,  no organomegaly Extremities: extremities normal, atraumatic, no cyanosis or edema Skin: Skin color, texture, turgor normal. No rashes or lesions Lymph nodes: Cervical, supraclavicular, and axillary nodes normal. No abnormal inguinal nodes palpated Neurologic: Grossly normal   Pelvic: External  genitalia:  no lesions              Urethra:  normal appearing urethra with no masses, tenderness or lesions              Bartholin's and Skene's: normal                 Vagina: normal appearing vagina with normal color and discharge, no lesions              Cervix: anteverted              Pap taken: no Bimanual Exam:  Uterus:  normal size, contour, position, consistency, mobility, non-tender              Adnexa: no mass, fullness, tenderness               Rectovaginal: Confirms               Anus:  normal sphincter tone, no lesions  A:  Well Woman with normal exam  S/P BTL and HTA  03/07/08  Not SA  Recent episode of asthmatic bronchitis with ER  P:   Pap smear as per guidelines   Mammogram due 12/2013  Counseled on breast self exam, mammography screening, adequate intake of calcium and vitamin D, diet and exercise, Kegel's exercises return annually or prn  An After Visit Summary was printed and given to the patient.

## 2013-12-18 ENCOUNTER — Encounter: Payer: Self-pay | Admitting: Family Medicine

## 2013-12-18 ENCOUNTER — Ambulatory Visit (INDEPENDENT_AMBULATORY_CARE_PROVIDER_SITE_OTHER): Payer: Managed Care, Other (non HMO) | Admitting: Family Medicine

## 2013-12-18 VITALS — BP 144/93 | HR 76 | Temp 98.0°F | Ht 67.0 in | Wt 215.0 lb

## 2013-12-18 DIAGNOSIS — R05 Cough: Secondary | ICD-10-CM

## 2013-12-18 DIAGNOSIS — R059 Cough, unspecified: Secondary | ICD-10-CM

## 2013-12-18 NOTE — Progress Notes (Signed)
   Subjective:    Patient ID: Ashley GurneySonja E Chambers, female    DOB: 08-16-1966, 48 y.o.   MRN: [id number]  HPI Followup recurrent episodes with cough thought related to either asthma or bronchospasm or recurrent bronchitis. She's never been a smoker. She's been seen at several urgent care and at the emergency department. She is currently on a steroid taper. They have set her up with an allergist appointment on March 11 and she has a pulmonary appointment tomorrow.  She continues to have chronic cough multiple times a day. Is nonproductive. She's had no fever sweats or chills. She's not had any unusual weight loss. She thinks this is related to her working environment where she has a desk right underneath the vent says the building is unclean. She has evidently contacted [employer] and they are investigating by her report.   Review of Systems  Denies fever, sweats, chills, unusual weight change.    Objective:   Physical Exam  Vital signs are reviewed no GENERAL: Well-developed female no acute distress LUNGS: Clear to auscultation without rales or wheeze. CV: Regular rate and rhythm ABDOMEN: Soft positive bowel sounds      Assessment & Plan:  Cough. I'm not sure what this was related to. Speech is very set up with the allergist and pulmonologist I don't think I need to add anything or do anything different. I would like to see her back in 4 weeks to followup on this. She's also due her annual exam sooner we'll set that up. Asked her to have the specialist centimeter nodes.

## 2013-12-18 NOTE — Telephone Encounter (Signed)
Cristal DeerChristopher w/[employer] insurance [phone] in reguards to disability on Leeana chambers.Caren GriffinsStanley A Dalton

## 2013-12-18 NOTE — Telephone Encounter (Signed)
Spoke with Min at [employer].  She was requesting info that will be provided on disability papers be given over the phone.  I informed her that our records dep is working on filling out the disability papers and they will receive this info as soon as papers are complete.  She verbalized understanding.  Will forward back to leslie to await paperwork.

## 2013-12-18 NOTE — Progress Notes (Signed)
Encounter reviewed by Dr. Shaleka Brines Silva.  

## 2013-12-19 ENCOUNTER — Ambulatory Visit (INDEPENDENT_AMBULATORY_CARE_PROVIDER_SITE_OTHER): Payer: Managed Care, Other (non HMO) | Admitting: Internal Medicine

## 2013-12-19 ENCOUNTER — Encounter: Payer: Self-pay | Admitting: Internal Medicine

## 2013-12-19 VITALS — BP 142/80 | HR 64 | Temp 97.7°F | Ht 66.0 in | Wt 219.4 lb

## 2013-12-19 DIAGNOSIS — J45909 Unspecified asthma, uncomplicated: Secondary | ICD-10-CM

## 2013-12-19 DIAGNOSIS — R05 Cough: Secondary | ICD-10-CM

## 2013-12-19 DIAGNOSIS — R059 Cough, unspecified: Secondary | ICD-10-CM

## 2013-12-19 MED ORDER — FLUTTER DEVI: Status: DC

## 2013-12-19 NOTE — Progress Notes (Signed)
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 HS needed inhaler for sports then around mid 90's required er eval for ? Asthma got better and stayed gone years until Oct 2014 with onset of chest tightness/ cough worse when lie down eval by UC in [place] started on albuterol/zpak/cough medication but never better so self referred 11/28/2013 to pulmonary clinic.    History of Present Illness  11/28/2013 1st [facility] office visit/ Ashley Schneider cc daily symptoms worse at bedtime of cough and green mucus - tightness is some better with albuterol but not with pred rx. Almost feels like  Choking when lies down at night. Worse with cold air exp/aerosol sprays rec First take delsym two tsp every 12 hours and supplement if needed with  tramadol 50 mg up to 2 every 4 hours to suppress the urge to cough at all or even clear your throat.  Once you have eliminated the cough for 3 straight days try reducing the tramadol first,  then the delsym as tolerated.   Pantoprazole (protonix) 40 mg   Take 30-60 min before first meal of the day and Pepcid 20 mg one bedtime and chlortrimeton 4 mg at bedtime until return to office - this is the best way to tell whether stomach acid is contributing to your problem.   GERD diet  Please see patient coordinator before you leave today  to schedule sinus CT  Please schedule a follow up office visit in 2 weeks, sooner if needed  Late add Bring all meds Had itching from augmentin/ nausea from Tramadol/ could not tol ppi or pepcid  12/14/13 ER eval> rx steroids/nebs   12/19/2013 f/u ov/Ashley Schneider re: refractory cough / ? pseudowheeze refractory symptoms since Oct 2014  Chief Complaint  Patient presents with  . Follow-up    Pt went to [facility] ED on 12/14/13 d/t asthma exacerbation. Pt states breathing is unchanged since last visit. Pesistant cough  with intermittent production of yellow thick mucous. C/o CP in morning and when coughing.   Sleeps fine p codeine  Wakes up with yellowish mucus but cough does not wake her up Prednisone>  no better even transiently  Proaire helps wheeze for up to a day  No obvious pattern in day to day or daytime variabilty or assoc chronic cough or cp or chest tightness, subjective wheeze overt sinus or hb symptoms. No unusual exp hx or h/o childhood pna/ asthma or knowledge of premature birth.  Sleeping ok p codeine without nocturnal  or early am exacerbation  of respiratory  c/o's or need for noct saba. Also denies any obvious fluctuation of symptoms with weather or environmental changes or other aggravating or alleviating factors except as outlined above   Current Medications, Allergies, Complete Past Medical History, Past Surgical History, Family History, and Social History were reviewed in [employer] record.  ROS  The following are not active complaints unless bolded sore throat, dysphagia, dental problems, itching, sneezing,  nasal congestion or excess/ purulent secretions, ear ache,   fever, chills, sweats, unintended wt loss, pleuritic or exertional cp, hemoptysis,  orthopnea pnd or leg swelling, presyncope, palpitations, heartburn, abdominal pain, anorexia, nausea, vomiting, diarrhea  or change in bowel or urinary habits, change in stools or urine, dysuria,hematuria,  rash, arthralgias, visual complaints, headache, numbness weakness or ataxia or problems  with walking or coordination,  change in mood/affect or memory.                       Objective:   Physical Exam   amb bf nad harsh barking cough with classic pseudowheeze   12/19/2013         219 Wt Readings from Last 3 Encounters:  11/28/13 215 lb (97.523 kg)  11/27/13 215 lb (97.523 kg)  10/28/13 219 lb (99.338 kg)     HEENT: nl dentition, turbinates, and orophanx. Nl external ear canals without cough  reflex   NECK :  without JVD/Nodes/TM/ nl carotid upstrokes bilaterally   LUNGS: no acc muscle use, clear to A and P bilaterally without cough on insp or exp maneuvers   CV:  RRR  no s3 or murmur or increase in P2, no edema   ABD:  soft and nontender with nl excursion in the supine position. No bruits or organomegaly, bowel sounds nl  MS:  warm without deformities, calf tenderness, cyanosis or clubbing  SKIN: warm and dry without lesions    NEURO:  alert, hopeless and helpless affect/ attitude to her care       Spirometry 11/28/2013 wnl in effort indep portion  cxr 12/14/13  No acute cardiopulmonary process seen.      Assessment & Plan:

## 2013-12-19 NOTE — Assessment & Plan Note (Addendum)
The proper method of use, as well as anticipated side effects, of a metered-dose inhaler are discussed and demonstrated to the patient. Improved effectiveness after extensive coaching during this visit to a level of approximately  75% so just use prn saba for now as not clear which if any of her non-specific symptoms is related to her lower airways

## 2013-12-19 NOTE — Patient Instructions (Addendum)
Stop prednisone since you are convinced it's not helping you  For wheezing/ difficulty breathing > proair 2 puffs up to every 4 hours as needed   For cough > flutter valve as much as possible and use the codeine cough syrup as much as you need but you need to stop the coughing as it causing you to harm your upper airway which is making you cough harder  [facility] for out of work until you see Dr Willa RoughHicks on March 11 and we will see you back here if needed

## 2013-12-19 NOTE — Assessment & Plan Note (Addendum)
-   spirometry 11/28/2013 > wnl in effort indep portion - Sinus CT  12/06/13 > Negative paranasal sinuses  I had an extended discussion with the patient today lasting 30 min of a 40 minute visit on the following issues:   Lack of cough resolution on a verified empirical regimen could mean an alternative diagnosis (anxiety related cough/vcd), persistence of the disease state (eg sinusitis or bronchiectasis, the former having been now excluded) , or inadequacy of currently available therapy (eg no medical rx available for non-acid gerd and pt not able to take adequate empirical trial to rule this out).  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

## 2013-12-24 NOTE — Telephone Encounter (Signed)
Ashley Schneider called back and she does have the pt paperwork, but that the pt will need come by and pay $25 before further processing can be done. She states they have a "packet" ready to send to the pt about this. I advised that I will call the pt to let her know to give her the option of coming by and paying the fee today so her paperwork can be processed asap. I spoke with the pt and advised. She was upset about this and states she has spoken with med rec several times and this has never been mentioned. I apologized for this. She asked to be transferred to med recs which I did. I advised Ashley Schneider when I spoke with her that the paperwork needs to be processed ASAP once the pt pays the fee. I will forward message to Ashley Schneider to keep a look out for forms so once they are sent to [facility] they can be done asap. Ashley CurieJennifer Masao Schneider, CMA

## 2013-12-24 NOTE — Telephone Encounter (Signed)
Patient calling back about disability paperwork.  She states this has to be completed by the end of the business day.  [phone]

## 2013-12-24 NOTE — Telephone Encounter (Signed)
I called [employer] and spoke with Eber Jonesarolyn to check on status of paperwork that was supposed to be sent down on 12-17-13. She states she does not have anything on this pt but that Marcelino DusterMichelle may have it since it is disability forms. She will have michelle call me back at ext: [phone].  Will await call back. Carron CurieJennifer Jakell Trusty, CMA

## 2013-12-30 NOTE — Telephone Encounter (Signed)
lmtcb x1 

## 2013-12-30 NOTE — Telephone Encounter (Signed)
Pt is aware that her paperwork has been completed. Advised her that [employer] should be contacting her about this.

## 2013-12-30 NOTE — Telephone Encounter (Signed)
They were completed as we discussed, no work until seen and cleared for work by Dr Willa RoughHicks which my records say was to be March 11 so that's the way it was filled out

## 2013-12-30 NOTE — Telephone Encounter (Signed)
[facility]

## 2014-01-15 ENCOUNTER — Ambulatory Visit: Payer: Managed Care, Other (non HMO) | Admitting: Family Medicine

## 2014-02-06 ENCOUNTER — Other Ambulatory Visit: Payer: Self-pay

## 2014-02-06 DIAGNOSIS — Z1231 Encounter for screening mammogram for malignant neoplasm of breast: Secondary | ICD-10-CM

## 2014-02-11 ENCOUNTER — Telehealth: Payer: Self-pay | Admitting: Family Medicine

## 2014-02-11 DIAGNOSIS — I10 Essential (primary) hypertension: Secondary | ICD-10-CM

## 2014-02-11 DIAGNOSIS — Z0000 Encounter for general adult medical examination without abnormal findings: Secondary | ICD-10-CM

## 2014-02-11 NOTE — Telephone Encounter (Signed)
Would like to have blood work done prior to visit on 02-24-14 Please advise

## 2014-02-11 NOTE — Telephone Encounter (Signed)
Please advise.Thank you.Ashley Schneider S [name]  

## 2014-02-12 NOTE — Telephone Encounter (Signed)
Left message on voicemail informing of message from MD. 

## 2014-02-12 NOTE — Telephone Encounter (Signed)
Dear Ashley Schneider Ashley Schneider is in Ashley Schneider should come fasting (no food 6-8 hours before---coffee or tea or juice OK) THANKS! Ashley Schneider

## 2014-02-13 ENCOUNTER — Other Ambulatory Visit: Payer: Self-pay | Admitting: Family Medicine

## 2014-02-24 ENCOUNTER — Encounter: Payer: Self-pay | Admitting: Family Medicine

## 2014-02-24 ENCOUNTER — Ambulatory Visit (INDEPENDENT_AMBULATORY_CARE_PROVIDER_SITE_OTHER): Payer: Managed Care, Other (non HMO) | Admitting: Family Medicine

## 2014-02-24 ENCOUNTER — Ambulatory Visit
Admission: RE | Admit: 2014-02-24 | Discharge: 2014-02-24 | Disposition: A | Discharge status: Home / Self Care | Payer: Managed Care, Other (non HMO) | Source: Ambulatory Visit

## 2014-02-24 VITALS — BP 137/82 | HR 70 | Temp 98.7°F | Ht 67.0 in | Wt 225.0 lb

## 2014-02-24 DIAGNOSIS — E669 Obesity, unspecified: Secondary | ICD-10-CM

## 2014-02-24 DIAGNOSIS — I10 Essential (primary) hypertension: Secondary | ICD-10-CM

## 2014-02-24 DIAGNOSIS — Z0000 Encounter for general adult medical examination without abnormal findings: Secondary | ICD-10-CM

## 2014-02-24 DIAGNOSIS — J45909 Unspecified asthma, uncomplicated: Secondary | ICD-10-CM

## 2014-02-24 DIAGNOSIS — Z1231 Encounter for screening mammogram for malignant neoplasm of breast: Secondary | ICD-10-CM

## 2014-02-24 LAB — CBC
HEMATOCRIT: 39.5 % (ref 36.0–46.0)
Hemoglobin: 14.3 g/dL (ref 12.0–15.0)
MCH: 31 pg (ref 26.0–34.0)
MCHC: 36.2 g/dL — ABNORMAL HIGH (ref 30.0–36.0)
MCV: 85.7 fL (ref 78.0–100.0)
Platelets: 331 10*3/uL (ref 150–400)
RBC: 4.61 MIL/uL (ref 3.87–5.11)
RDW: 13.5 % (ref 11.5–15.5)
WBC: 5.2 10*3/uL (ref 4.0–10.5)

## 2014-02-24 LAB — LIPID PANEL
CHOLESTEROL: 183 mg/dL (ref 0–200)
HDL: 61 mg/dL (ref >39)
LDL Cholesterol: 109 mg/dL — ABNORMAL HIGH (ref 0–99)
Total CHOL/HDL Ratio: 3 Ratio
Triglycerides: 64 mg/dL (ref <150)
VLDL: 13 mg/dL (ref 0–40)

## 2014-02-24 LAB — POCT URINALYSIS DIPSTICK
BILIRUBIN UA: NEGATIVE
Glucose, UA: NEGATIVE
Ketones, UA: NEGATIVE
NITRITE UA: NEGATIVE
Protein, UA: NEGATIVE
Spec Grav, UA: 1.025
UROBILINOGEN UA: 0.2
pH, UA: 6

## 2014-02-24 LAB — COMPREHENSIVE METABOLIC PANEL
ALBUMIN: 4.3 g/dL (ref 3.5–5.2)
ALK PHOS: 77 U/L (ref 39–117)
ALT: 10 U/L (ref 0–35)
AST: 20 U/L (ref 0–37)
BUN: 12 mg/dL (ref 6–23)
CO2: 31 mEq/L (ref 19–32)
CREATININE: 0.89 mg/dL (ref 0.50–1.10)
Calcium: 9.5 mg/dL (ref 8.4–10.5)
Chloride: 97 mEq/L (ref 96–112)
GLUCOSE: 94 mg/dL (ref 70–99)
POTASSIUM: 3.4 meq/L — AB (ref 3.5–5.3)
Sodium: 137 mEq/L (ref 135–145)
Total Bilirubin: 0.7 mg/dL (ref 0.2–1.2)
Total Protein: 7.5 g/dL (ref 6.0–8.3)

## 2014-02-24 LAB — POCT UA - GLUCOSE/PROTEIN
Glucose, UA: NEGATIVE
PROTEIN UA: NEGATIVE

## 2014-02-24 LAB — TSH: TSH: 0.443 u[IU]/mL (ref 0.350–4.500)

## 2014-02-24 NOTE — Progress Notes (Signed)
Family Medicine Office Visit Note   Subjective:   Patient ID: Ashley Schneider, female  DOB: Jan 18, 1966, 48 y.o.. MRN: [id number]   Pt that comes today for her annual exam. She reports feeling well and compliant with her medications. She denies noticeable side effects of medications.  regarding her asthma she has been stable, taking Qvar and Patanase and no flares since February/ 2015. Pt gets her pap smears at her OBGYN and her next due is in 2016. She has never had abnormal paps. Has Hx of uterine ablation and has been stable since her procedure in 2009. Denies vaginal bleeding or discharge. Pt has mammography scheduled for today at noon.   Review of Systems:  Pt denies SOB, chest pain, palpitations, headaches, dizziness, numbness or weakness. No changes on urinary or BM habits. No unintentional weigh loss/gain.  Objective:   Physical Exam: Gen:  NAD HEENT: Moist mucous membranes. Neck supple no adenopathies. No goiter. No masses. No JVD  EOMI and PERRL. Normal TM and ear canals bilaterally. No mouth lesions seen. Oropharynx is normal w/o exudates.  CV: Regular rate and rhythm, no murmurs rubs or gallops PULM: Clear to auscultation bilaterally. No wheezes/rales/rhonchi ABD: Soft, non tender, non distended, normal bowel sounds EXT: No edema Neuro: Alert and oriented x3. No focalization  Assessment & Plan:

## 2014-02-24 NOTE — Patient Instructions (Addendum)
Your annual exam today is normal. Your next pap smear is due next year.  No changes on your current medications have been made. I will call you with the labs results if they come back abnormal otherwise you will receive a letter. Make your next appointment in 6 months for follow up or sooner if needed.

## 2014-02-24 NOTE — Assessment & Plan Note (Signed)
Doing well. No complaints. Compliant with her meds. Will check labs and inform results.

## 2014-02-24 NOTE — Addendum Note (Signed)
Addended by: Jennette BillBUSICK, ROBERT L on: 02/24/2014 10:10 AM   Modules accepted: Orders

## 2014-02-24 NOTE — Assessment & Plan Note (Signed)
F/u with allergy. She is on Qvar and Patanase with no exacerbations inn 3 months. No changes in plan.

## 2014-02-24 NOTE — Assessment & Plan Note (Signed)
F/u by cardiology for HTN and hypokalemia. On HCTZ and K supplementation. No side effects noted,. BP is controlled.  No change sin plan.  will obtain cbc, cmet and lipid panel.

## 2014-02-25 LAB — HIV ANTIBODY (ROUTINE TESTING W REFLEX): HIV 1&2 Ab, 4th Generation: NONREACTIVE

## 2014-02-26 ENCOUNTER — Encounter: Payer: Self-pay | Admitting: Family Medicine

## 2014-03-17 ENCOUNTER — Encounter: Payer: Self-pay | Admitting: Internal Medicine

## 2014-04-21 ENCOUNTER — Other Ambulatory Visit: Payer: Self-pay | Admitting: *Deleted

## 2014-04-21 MED ORDER — HYDROCHLOROTHIAZIDE 25 MG PO TABS
25.0000 mg | ORAL_TABLET | Freq: Every day | ORAL | Status: DC
Start: 2014-04-21 — End: 2014-11-24

## 2014-04-21 MED ORDER — POTASSIUM CHLORIDE CRYS ER 20 MEQ PO TBCR: 40.0000 meq | EXTENDED_RELEASE_TABLET | Freq: Every day | ORAL | Status: DC

## 2014-08-18 ENCOUNTER — Encounter: Payer: Self-pay | Admitting: Family Medicine

## 2014-11-24 ENCOUNTER — Other Ambulatory Visit: Payer: Self-pay

## 2014-11-24 MED ORDER — HYDROCHLOROTHIAZIDE 25 MG PO TABS: 25.0000 mg | ORAL_TABLET | Freq: Every day | ORAL | Status: DC

## 2014-11-24 MED ORDER — POTASSIUM CHLORIDE CRYS ER 20 MEQ PO TBCR: 40.0000 meq | EXTENDED_RELEASE_TABLET | Freq: Every day | ORAL | Status: DC

## 2014-12-22 ENCOUNTER — Encounter: Payer: Self-pay | Admitting: Nurse Practitioner

## 2014-12-22 ENCOUNTER — Ambulatory Visit (INDEPENDENT_AMBULATORY_CARE_PROVIDER_SITE_OTHER): Payer: Managed Care, Other (non HMO) | Admitting: Nurse Practitioner

## 2014-12-22 VITALS — BP 130/78 | HR 72 | Ht 65.5 in | Wt 222.0 lb

## 2014-12-22 DIAGNOSIS — Z01419 Encounter for gynecological examination (general) (routine) without abnormal findings: Secondary | ICD-10-CM | POA: Diagnosis not present

## 2014-12-22 DIAGNOSIS — Z0000 Encounter for general adult medical examination without abnormal findings: Secondary | ICD-10-CM

## 2014-12-22 NOTE — Patient Instructions (Signed)

## 2014-12-22 NOTE — Progress Notes (Signed)
Patient ID: Ashley Schneider, female   DOB: 1965-10-21, 49 y.o.   MRN: [id number] 49 y.o. [id number] Single  African American Fe here for annual exam. No new diagnosis.  Having spotting 1 day a month for cycle since the ablation in 2009.  Some breast tenderness.   Going to the gym 3-4 a week.  Recent allergy testing but now on Proventil only prn.  Rushing today that caused BP elevation.  Patient's last menstrual period was 12/17/2014 (exact date).          Sexually active: No.  The current method of family planning is abstinence, tubal ligation and ablation.    Exercising: Yes.    cardio and strength training Smoker:  no  Health Maintenance: Pap:  12/12/11, negative with neg HR HPV MMG:  02/24/14, Bi-Rads 1:  Negative  TDaP:  11/18/10 Labs:  PCP, 02/2015   reports that she has never smoked. She has never used smokeless tobacco. She reports that she does not drink alcohol or use illicit drugs.  Past Medical History  Diagnosis Date  . Hypertension   . Pregnancy induced hypertension 1995  . Obesity   . Childhood asthma   . Asthma with bronchitis 08/2013  . Menorrhagia     Past Surgical History  Procedure Laterality Date  . Tubal ligation    . Endometrial ablation  2009  . Incision and drainage abscess Left 06/2013    left breast    Current Outpatient Prescriptions  Medication Sig Dispense Refill  . BIOTIN PO Take 20,000 mcg by mouth daily.     . hydrochlorothiazide (HYDRODIURIL) 25 MG tablet Take 1 tablet (25 mg total) by mouth daily. 30 tablet 1  . Multiple Vitamin (MULTIVITAMIN WITH MINERALS) TABS tablet Take 1 tablet by mouth 2 (two) times a week. Take on Monday and Thursday    . potassium chloride SA (K-DUR,KLOR-CON) 20 MEQ tablet Take 2 tablets (40 mEq total) by mouth daily. 60 tablet 1  . albuterol (PROVENTIL HFA;VENTOLIN HFA) 108 (90 BASE) MCG/ACT inhaler Inhale into the lungs every 6 (six) hours as needed for wheezing or shortness of breath.     No current facility-administered  medications for this visit.    Family History  Problem Relation Age of Onset  . Hypertension Mother   . Hypertension Father   . Diabetes Father   . Prostate cancer Maternal Uncle   . Diabetic kidney disease Maternal Uncle   . Lung cancer Maternal Uncle   . Hypertension Brother   . Hyperlipidemia Brother   . Hypertension Brother   . Hyperlipidemia Brother     ROS:  Pertinent items are noted in HPI.  Otherwise, a comprehensive ROS was negative.  Exam:   BP 140/92 mmHg  Pulse 72  Ht 5' 5.5" (1.664 m)  Wt 222 lb (100.699 kg)  BMI 36.37 kg/m2  LMP 12/17/2014 (Exact Date) Height: 5' 5.5" (166.4 cm) Ht Readings from Last 3 Encounters:  12/22/14 5' 5.5" (1.664 m)  02/24/14  (1.702 m)  12/19/13  (1.676 m)    General appearance: alert, cooperative and appears stated age Head: Normocephalic, without obvious abnormality, atraumatic Neck: no adenopathy, supple, symmetrical, trachea midline and thyroid normal to inspection and palpation Lungs: clear to auscultation bilaterally Breasts: normal appearance, no masses or tenderness Heart: regular rate and rhythm Abdomen: soft, non-tender; no masses,  no organomegaly Extremities: extremities normal, atraumatic, no cyanosis or edema Skin: Skin color, texture, turgor normal. No rashes or lesions Lymph nodes: Cervical, supraclavicular,  and axillary nodes normal. No abnormal inguinal nodes palpated Neurologic: Grossly normal   Pelvic: External genitalia:  no lesions              Urethra:  normal appearing urethra with no masses, tenderness or lesions              Bartholin's and Skene's: normal                 Vagina: normal appearing vagina with normal color and discharge, no lesions              Cervix: anteverted              Pap taken: Yes.   Bimanual Exam:  Uterus:  normal size, contour, position, consistency, mobility, non-tender              Adnexa: no mass, fullness, tenderness               Rectovaginal: Confirms                Anus:  normal sphincter tone, no lesions  Chaperone present:  yes  A:  Well Woman with normal exam  S/P BTL and HTA 03/07/08 Not SA Recent episode of asthmatic bronchitis and allergy testing  P:   Reviewed health and wellness pertinent to exam  Pap smear taken today  Mammogram is due 5/16  Counseled on breast self exam, mammography screening, adequate intake of calcium and vitamin D, diet and exercise, Kegel's exercises return annually or prn  An After Visit Summary was printed and given to the patient.

## 2014-12-24 LAB — IPS PAP TEST WITH HPV

## 2014-12-28 NOTE — Progress Notes (Signed)
Encounter reviewed by Dr. Mumin Denomme Silva.  

## 2015-01-20 ENCOUNTER — Other Ambulatory Visit: Payer: Self-pay

## 2015-01-20 DIAGNOSIS — Z1231 Encounter for screening mammogram for malignant neoplasm of breast: Secondary | ICD-10-CM

## 2015-02-11 ENCOUNTER — Ambulatory Visit (INDEPENDENT_AMBULATORY_CARE_PROVIDER_SITE_OTHER): Payer: Managed Care, Other (non HMO) | Admitting: Cardiovascular Disease

## 2015-02-11 ENCOUNTER — Encounter: Payer: Self-pay | Admitting: Cardiovascular Disease

## 2015-02-11 VITALS — BP 130/94 | HR 72 | Ht 65.5 in | Wt 221.8 lb

## 2015-02-11 DIAGNOSIS — I10 Essential (primary) hypertension: Secondary | ICD-10-CM

## 2015-02-11 MED ORDER — HYDROCHLOROTHIAZIDE 25 MG PO TABS: 25.0000 mg | ORAL_TABLET | Freq: Every day | ORAL | Status: DC

## 2015-02-11 MED ORDER — POTASSIUM CHLORIDE CRYS ER 20 MEQ PO TBCR: 40.0000 meq | EXTENDED_RELEASE_TABLET | Freq: Every day | ORAL | Status: DC

## 2015-02-11 NOTE — Patient Instructions (Addendum)
Medication Instructions:  Your physician recommends that you continue on your current medications as directed. Please refer to the Current Medication list given to you today.   Labwork: None - please have lab work faxed to [phone] ATTN:  Dr. Elease HashimotoNahser If your potassium level is normal, we may be able to decrease your Kdur (potassium supplement)  Testing/Procedures: None  Follow-Up: Your physician wants you to follow-up in: 1 year with Dr. Elease HashimotoNahser.  You will receive a reminder letter in the mail two months in advance. If you don't receive a letter, please call our office to schedule the follow-up appointment.

## 2015-02-11 NOTE — Progress Notes (Signed)
Cardiology Office Note   Date:  02/11/2015   ID:  Ashley Schneider, DOB 1966-09-06, MRN [id number]  PCP:  Denny Levy, MD  Cardiologist:   Elease Hashimoto Deloris Ping, MD   Chief Complaint  Patient presents with  . Hypertension   Problem List: 1. HTN    History of Present Illness: Ashley Schneider is a 49 y.o. female who presents for HTN. No CP or dyspnea Working out since Oct.  Rare episodes of asthma     Past Medical History  Diagnosis Date  . Hypertension   . Pregnancy induced hypertension 1995  . Obesity   . Childhood asthma   . Asthma with bronchitis 08/2013  . Menorrhagia     Past Surgical History  Procedure Laterality Date  . Tubal ligation    . Endometrial ablation  2009  . Incision and drainage abscess Left 06/2013    left breast     Current Outpatient Prescriptions  Medication Sig Dispense Refill  . BIOTIN PO Take 50,000 mcg by mouth daily.     . hydrochlorothiazide (HYDRODIURIL) 25 MG tablet Take 1 tablet (25 mg total) by mouth daily. 30 tablet 1  . Multiple Vitamin (MULTIVITAMIN WITH MINERALS) TABS tablet Take 1 tablet by mouth 2 (two) times a week. Take on Monday and Thursday    . potassium chloride SA (K-DUR,KLOR-CON) 20 MEQ tablet Take 2 tablets (40 mEq total) by mouth daily. 60 tablet 1   No current facility-administered medications for this visit.    Allergies:   Other and Penicillins    Social History:  The patient  reports that she has never smoked. She has never used smokeless tobacco. She reports that she does not drink alcohol or use illicit drugs.   Family History:  The patient's family history includes Diabetes in her father; Diabetic kidney disease in her maternal uncle; Hyperlipidemia in her brother and brother; Hypertension in her brother, brother, father, and mother; Lung cancer in her maternal uncle; Prostate cancer in her maternal uncle.    ROS:  Please see the history of present illness.    Review of Systems: Constitutional:  denies  fever, chills, diaphoresis, appetite change and fatigue.  HEENT: denies photophobia, eye pain, redness, hearing loss, ear pain, congestion, sore throat, rhinorrhea, sneezing, neck pain, neck stiffness and tinnitus.  Respiratory: denies SOB, DOE, cough, chest tightness, and wheezing.  Cardiovascular: denies chest pain, palpitations and leg swelling.  Gastrointestinal: denies nausea, vomiting, abdominal pain, diarrhea, constipation, blood in stool.  Genitourinary: denies dysuria, urgency, frequency, hematuria, flank pain and difficulty urinating.  Musculoskeletal: denies  myalgias, back pain, joint swelling, arthralgias and gait problem.   Skin: denies pallor, rash and wound.  Neurological: denies dizziness, seizures, syncope, weakness, light-headedness, numbness and headaches.   Hematological: denies adenopathy, easy bruising, personal or family bleeding history.  Psychiatric/ Behavioral: denies suicidal ideation, mood changes, confusion, nervousness, sleep disturbance and agitation.       All other systems are reviewed and negative.    PHYSICAL EXAM: VS:  BP 130/94 mmHg  Pulse 72  Ht 5' 5.5" (1.664 m)  Wt 221 lb 12.8 oz (100.608 kg)  BMI 36.34 kg/m2 , BMI Body mass index is 36.34 kg/(m^2). GEN: Well nourished, well developed, in no acute distress HEENT: normal Neck: no JVD, carotid bruits, or masses Cardiac: RRR; no murmurs, rubs, or gallops,no edema  Respiratory:  clear to auscultation bilaterally, normal work of breathing GI: soft, nontender, nondistended, + BS MS: no deformity or atrophy Skin: warm  and dry, no rash Neuro:  Strength and sensation are intact Psych: normal   EKG:  EKG is ordered today. The ekg ordered today demonstrates  :  NSR at 72, normal eCG    Recent Labs: 02/24/2014: ALT 10; BUN 12; Creatinine 0.89; Hemoglobin 14.3; Platelets 331; Potassium 3.4*; Sodium 137; TSH 0.443    Lipid Panel    Component Value Date/Time   CHOL 183 02/24/2014 0926   TRIG  64 02/24/2014 0926   HDL 61 02/24/2014 0926   CHOLHDL 3.0 02/24/2014 0926   VLDL 13 02/24/2014 0926   LDLCALC 109* 02/24/2014 0926      Wt Readings from Last 3 Encounters:  02/11/15 221 lb 12.8 oz (100.608 kg)  12/22/14 222 lb (100.699 kg)  02/24/14 225 lb (102.059 kg)      Other studies Reviewed: Additional studies/ records that were reviewed today include: . Review of the above records demonstrates:    ASSESSMENT AND PLAN:  1.   Essential hypertension:   He's been out of her HCTZ for several days. Her blood pressure is mildly elevated. We'll continue with her same medications and I'll see her again in one year. She'll be getting labs next month when she has her physical exam with Dr. Jennette KettleNeal.   Current medicines are reviewed at length with the patient today.  The patient does not have concerns regarding medicines.  The following changes have been made:  no change  Labs/ tests ordered today include:  No orders of the defined types were placed in this encounter.     Disposition:   FU with me in 1 year    Signed, Melea Prezioso, Deloris PingPhilip J, MD  02/11/2015 4:23 PM    [facility] [address], [place], [place]  [address] Phone: [phone]; Fax: [phone]

## 2015-03-27 ENCOUNTER — Ambulatory Visit
Admission: RE | Admit: 2015-03-27 | Discharge: 2015-03-27 | Disposition: A | Discharge status: Home / Self Care | Payer: Managed Care, Other (non HMO) | Source: Ambulatory Visit

## 2015-03-27 DIAGNOSIS — Z1231 Encounter for screening mammogram for malignant neoplasm of breast: Secondary | ICD-10-CM

## 2015-04-01 ENCOUNTER — Encounter: Payer: Self-pay | Admitting: Family Medicine

## 2015-04-01 ENCOUNTER — Ambulatory Visit (INDEPENDENT_AMBULATORY_CARE_PROVIDER_SITE_OTHER): Payer: Managed Care, Other (non HMO) | Admitting: Family Medicine

## 2015-04-01 VITALS — BP 132/80 | HR 73 | Temp 98.3°F | Ht 66.0 in | Wt 215.5 lb

## 2015-04-01 DIAGNOSIS — I10 Essential (primary) hypertension: Secondary | ICD-10-CM

## 2015-04-01 DIAGNOSIS — Z0000 Encounter for general adult medical examination without abnormal findings: Secondary | ICD-10-CM

## 2015-04-01 LAB — CBC WITH DIFFERENTIAL/PLATELET
Basophils Absolute: 0 10*3/uL (ref 0.0–0.1)
Basophils Relative: 0 % (ref 0–1)
EOS ABS: 0.4 10*3/uL (ref 0.0–0.7)
Eosinophils Relative: 7 % — ABNORMAL HIGH (ref 0–5)
HEMATOCRIT: 40.5 % (ref 36.0–46.0)
HEMOGLOBIN: 14.1 g/dL (ref 12.0–15.0)
LYMPHS PCT: 40 % (ref 12–46)
Lymphs Abs: 2.4 10*3/uL (ref 0.7–4.0)
MCH: 31.3 pg (ref 26.0–34.0)
MCHC: 34.8 g/dL (ref 30.0–36.0)
MCV: 90 fL (ref 78.0–100.0)
MONOS PCT: 8 % (ref 3–12)
MPV: 8.9 fL (ref 8.6–12.4)
Monocytes Absolute: 0.5 10*3/uL (ref 0.1–1.0)
NEUTROS ABS: 2.7 10*3/uL (ref 1.7–7.7)
NEUTROS PCT: 45 % (ref 43–77)
Platelets: 304 10*3/uL (ref 150–400)
RBC: 4.5 MIL/uL (ref 3.87–5.11)
RDW: 13.6 % (ref 11.5–15.5)
WBC: 6 10*3/uL (ref 4.0–10.5)

## 2015-04-01 LAB — COMPREHENSIVE METABOLIC PANEL
ALBUMIN: 4.2 g/dL (ref 3.5–5.2)
ALT: 12 U/L (ref 0–35)
AST: 19 U/L (ref 0–37)
Alkaline Phosphatase: 76 U/L (ref 39–117)
BUN: 10 mg/dL (ref 6–23)
CO2: 26 meq/L (ref 19–32)
Calcium: 9.1 mg/dL (ref 8.4–10.5)
Chloride: 99 mEq/L (ref 96–112)
Creat: 0.89 mg/dL (ref 0.50–1.10)
GLUCOSE: 83 mg/dL (ref 70–99)
Potassium: 3.6 mEq/L (ref 3.5–5.3)
SODIUM: 137 meq/L (ref 135–145)
Total Bilirubin: 1.3 mg/dL — ABNORMAL HIGH (ref 0.2–1.2)
Total Protein: 7 g/dL (ref 6.0–8.3)

## 2015-04-01 LAB — LIPID PANEL
Cholesterol: 195 mg/dL (ref 0–200)
HDL: 59 mg/dL (ref >=46)
LDL Cholesterol: 115 mg/dL — ABNORMAL HIGH (ref 0–99)
Total CHOL/HDL Ratio: 3.3 Ratio
Triglycerides: 105 mg/dL (ref <150)
VLDL: 21 mg/dL (ref 0–40)

## 2015-04-01 NOTE — Patient Instructions (Signed)
This should catch you up on your lab work. I will send you a note in the mail within the next week about your results. Great to see you !

## 2015-04-07 NOTE — Progress Notes (Addendum)
   Subjective:    Patient ID: Ashley Schneider, female    DOB: [birth year], 49 y.o.   MRN: [id number]  HPI  Here for check up No specific problems Wants to make sure she is up to date on all preventive needs  HTN: taking meds regularly without problems. Always has some elevated BP when we first check it--says she thinks it is stress over whether it is going to be high or not. Outside readings usually in 130s systolic and bottom number 70-80. No chest pains or SOB.  Review of Systems Review of Systems  Constitutional: Negative for: fever, activity change, appetite change, fatigue, unexpected weight change.  HEENT: Negative for: ear pain, sore throat, trouble swallowing.  No neck rigidity or stiffness.  Eyes: Negative for eye pain and no visual disturbances.  Respiratory: Negative for cough, no difficulty breathing.  No SOB. Cardiovascular: Negative for: leg swelling. No chest pain or heart palpitations. Gastrointestinal: Negative for: nausea, abdominal pain, diarrhea, constipation. Noted no blood in stool.  Endocrine: No increased urination and no increased thirst. No cold or heat intolerance that is a change from baseline. Genitourinary: No urinary frequency, no  urinary pain. No genital sores or lesions. Musculoskeletal: Negative for joint swelling and arthralgias.  Neurological: Negative for dizziness, syncope, weakness and headaches.  Psychiatric/Behavioral: Negative for: confusion, sleep disturbance, dysphoric mood, decreased concentration and agitation. The patient is not nervous/anxious.        Objective:   Physical Exam  Vital signs reviewed GENERALl: Well developed, well nourished, in no acute distress. NECK: Supple, FROM, without lymphadenopathy.  THYROID: normal without nodularity CAROTID ARTERIES: without bruits LUNGS: clear to auscultation bilaterally. No wheezes or rales. HEART: Regular rate and rhythm, no murmurs ABDOMEN: soft with positive bowel sounds MSK: MOE x  4 SKIN no rash NEURO: no focal deficits       Assessment & Plan:  1. Preventive visit 2. Labs for hypertension

## 2015-04-07 NOTE — Assessment & Plan Note (Signed)
Recheck much more reasonable. It seems to coincide with her reported outpt numbers. No med changes. Will get labs.

## 2015-04-22 ENCOUNTER — Encounter: Payer: Self-pay | Admitting: Family Medicine

## 2015-04-22 NOTE — Addendum Note (Signed)
Addended byDenny Levy: Tahra Hitzeman L on: 04/22/2015 03:30 PM   Modules accepted: Level of Service

## 2015-12-23 ENCOUNTER — Ambulatory Visit (INDEPENDENT_AMBULATORY_CARE_PROVIDER_SITE_OTHER): Payer: Managed Care, Other (non HMO) | Admitting: Nurse Practitioner

## 2015-12-23 ENCOUNTER — Encounter: Payer: Self-pay | Admitting: Nurse Practitioner

## 2015-12-23 VITALS — BP 144/98 | HR 72 | Ht 65.25 in | Wt 200.0 lb

## 2015-12-23 DIAGNOSIS — Z0000 Encounter for general adult medical examination without abnormal findings: Secondary | ICD-10-CM | POA: Diagnosis not present

## 2015-12-23 DIAGNOSIS — Z113 Encounter for screening for infections with a predominantly sexual mode of transmission: Secondary | ICD-10-CM | POA: Diagnosis not present

## 2015-12-23 DIAGNOSIS — Z01419 Encounter for gynecological examination (general) (routine) without abnormal findings: Secondary | ICD-10-CM | POA: Diagnosis not present

## 2015-12-23 LAB — COMPREHENSIVE METABOLIC PANEL
ALBUMIN: 4.6 g/dL (ref 3.6–5.1)
ALK PHOS: 76 U/L (ref 33–115)
ALT: 10 U/L (ref 6–29)
AST: 16 U/L (ref 10–35)
BILIRUBIN TOTAL: 0.6 mg/dL (ref 0.2–1.2)
BUN: 11 mg/dL (ref 7–25)
CALCIUM: 9.5 mg/dL (ref 8.6–10.2)
CO2: 29 mmol/L (ref 20–31)
Chloride: 100 mmol/L (ref 98–110)
Creat: 1.03 mg/dL (ref 0.50–1.10)
Glucose, Bld: 97 mg/dL (ref 65–99)
Potassium: 3.9 mmol/L (ref 3.5–5.3)
Sodium: 139 mmol/L (ref 135–146)
TOTAL PROTEIN: 7.5 g/dL (ref 6.1–8.1)

## 2015-12-23 LAB — CBC WITH DIFFERENTIAL/PLATELET
BASOS ABS: 0 10*3/uL (ref 0.0–0.1)
BASOS PCT: 0 % (ref 0–1)
EOS ABS: 0.7 10*3/uL (ref 0.0–0.7)
Eosinophils Relative: 11 % — ABNORMAL HIGH (ref 0–5)
HCT: 43 % (ref 36.0–46.0)
Hemoglobin: 15 g/dL (ref 12.0–15.0)
Lymphocytes Relative: 31 % (ref 12–46)
Lymphs Abs: 1.9 10*3/uL (ref 0.7–4.0)
MCH: 31.1 pg (ref 26.0–34.0)
MCHC: 34.9 g/dL (ref 30.0–36.0)
MCV: 89.2 fL (ref 78.0–100.0)
MONOS PCT: 7 % (ref 3–12)
MPV: 9.1 fL (ref 8.6–12.4)
Monocytes Absolute: 0.4 10*3/uL (ref 0.1–1.0)
NEUTROS PCT: 51 % (ref 43–77)
Neutro Abs: 3.1 10*3/uL (ref 1.7–7.7)
Platelets: 333 10*3/uL (ref 150–400)
RBC: 4.82 MIL/uL (ref 3.87–5.11)
RDW: 12.9 % (ref 11.5–15.5)
WBC: 6 10*3/uL (ref 4.0–10.5)

## 2015-12-23 LAB — LIPID PANEL
CHOLESTEROL: 166 mg/dL (ref 125–200)
HDL: 58 mg/dL (ref >=46)
LDL Cholesterol: 97 mg/dL (ref <130)
TRIGLYCERIDES: 56 mg/dL (ref <150)
Total CHOL/HDL Ratio: 2.9 Ratio (ref <=5.0)
VLDL: 11 mg/dL (ref <30)

## 2015-12-23 LAB — TSH: TSH: 0.64 mIU/L

## 2015-12-23 NOTE — Patient Instructions (Signed)

## 2015-12-23 NOTE — Progress Notes (Signed)
Patient ID: Ashley Schneider, female   DOB: 1966-03-26, 50 y.o.   MRN: [id number]  50 y.o. [id number] Single  African American Fe here for annual exam.  Usually spotting X 1 day since ablation in 2009.  Her daughter graduated high school last year, son graduated from [employer].  She is very proud of her children.  She has been patiently waiting until God brings her a mate - she has been working with a young man who's wife died and they have started dating  - now engaged and to be married in July !!  She is very happy.  They are not SA but desires STD's as each is getting done.  Patient's last menstrual period was 12/12/2015 (exact date).          Sexually active: No.  The current method of family planning is tubal ligation, abstinence and ablation.    Exercising: Yes.    Gym/ health club routine includes cardio and weight training 4 times per week. Smoker:  no  Health Maintenance: Pap: 12/22/14, Negative with neg HR HPV MMG: 03/27/15, Bi-Rads 1: Negative  TDaP:  11/18/10 HIV: 02/24/14 Labs: to have drawn today   reports that she has never smoked. She has never used smokeless tobacco. She reports that she does not drink alcohol or use illicit drugs.  Past Medical History  Diagnosis Date  . Hypertension   . Pregnancy induced hypertension 1995  . Obesity   . Childhood asthma   . Asthma with bronchitis 08/2013  . Menorrhagia     Past Surgical History  Procedure Laterality Date  . Tubal ligation    . Endometrial ablation  2009  . Incision and drainage abscess Left 06/2013    left breast    Current Outpatient Prescriptions  Medication Sig Dispense Refill  . BIOTIN PO Take 50,000 mcg by mouth daily.     . hydrochlorothiazide (HYDRODIURIL) 25 MG tablet Take 1 tablet (25 mg total) by mouth daily. 90 tablet 3  . Multiple Vitamin (MULTIVITAMIN WITH MINERALS) TABS tablet Take 1 tablet by mouth 2 (two) times a week. Take on Monday and Thursday    . potassium chloride SA (K-DUR,KLOR-CON) 20  MEQ tablet Take 2 tablets (40 mEq total) by mouth daily. 60 tablet 11   No current facility-administered medications for this visit.    Family History  Problem Relation Age of Onset  . Hypertension Mother   . Hypertension Father   . Diabetes Father   . Prostate cancer Maternal Uncle   . Diabetic kidney disease Maternal Uncle   . Lung cancer Maternal Uncle   . Hypertension Brother   . Hyperlipidemia Brother   . Hypertension Brother   . Hyperlipidemia Brother     ROS:  Pertinent items are noted in HPI.  Otherwise, a comprehensive ROS was negative.  Exam:   BP 144/98 mmHg  Pulse 72  Ht 5' 5.25" (1.657 m)  Wt 200 lb (90.719 kg)  BMI 33.04 kg/m2  LMP 12/12/2015 (Exact Date) Height: 5' 5.25" (165.7 cm) Ht Readings from Last 3 Encounters:  12/23/15 5' 5.25" (1.657 m)  04/01/15  (1.676 m)  02/11/15 5' 5.5" (1.664 m)    General appearance: alert, cooperative and appears stated age Head: Normocephalic, without obvious abnormality, atraumatic Neck: no adenopathy, supple, symmetrical, trachea midline and thyroid normal to inspection and palpation Lungs: clear to auscultation bilaterally Breasts: normal appearance, no masses or tenderness Heart: regular rate and rhythm Abdomen: soft, non-tender; no masses,  no organomegaly Extremities: extremities normal, atraumatic, no cyanosis or edema Skin: Skin color, texture, turgor normal. No rashes or lesions Lymph nodes: Cervical, supraclavicular, and axillary nodes normal. No abnormal inguinal nodes palpated Neurologic: Grossly normal   Pelvic: External genitalia:  no lesions              Urethra:  normal appearing urethra with no masses, tenderness or lesions              Bartholin's and Skene's: normal                 Vagina: normal appearing vagina with normal color and discharge, no lesions              Cervix: anteverted              Pap taken: No. Bimanual Exam:  Uterus:  normal size, contour, position, consistency,  mobility, non-tender              Adnexa: no mass, fullness, tenderness               Rectovaginal: Confirms               Anus:  normal sphincter tone, no lesions  Chaperone present: yes  A:  Well Woman with normal exam  S/P BTL and HTA 03/07/08  HTN - on med's with elevated BP today (white coat HTN) Not SA, but engaged to be married 04/2016   P:   Reviewed health and wellness pertinent to exam  Pap smear as above  Mammogram is due 03/2016  Will follow with labs  Counseled on breast self exam, mammography screening, adequate intake of calcium and vitamin D, diet and exercise return annually or prn  An After Visit Summary was printed and given to the patient.

## 2015-12-24 LAB — STD PANEL
HEP B S AG: NEGATIVE
HIV 1&2 Ab, 4th Generation: NONREACTIVE

## 2015-12-24 LAB — VITAMIN D 25 HYDROXY (VIT D DEFICIENCY, FRACTURES): Vit D, 25-Hydroxy: 9 ng/mL — ABNORMAL LOW (ref 30–100)

## 2015-12-25 ENCOUNTER — Other Ambulatory Visit: Payer: Self-pay | Admitting: Nurse Practitioner

## 2015-12-25 LAB — IPS N GONORRHOEA AND CHLAMYDIA BY PCR

## 2015-12-25 MED ORDER — VITAMIN D (ERGOCALCIFEROL) 1.25 MG (50000 UNIT) PO CAPS: 50000.0000 [IU] | ORAL_CAPSULE | ORAL | Status: DC

## 2015-12-27 NOTE — Progress Notes (Signed)
Encounter reviewed by Dr. Brook Amundson C. Silva.  

## 2016-03-24 ENCOUNTER — Other Ambulatory Visit: Payer: Self-pay | Admitting: Nurse Practitioner

## 2016-03-24 DIAGNOSIS — Z1231 Encounter for screening mammogram for malignant neoplasm of breast: Secondary | ICD-10-CM

## 2016-04-15 ENCOUNTER — Ambulatory Visit
Admission: RE | Admit: 2016-04-15 | Discharge: 2016-04-15 | Disposition: A | Discharge status: Home / Self Care | Payer: 59 | Source: Ambulatory Visit | Attending: Nurse Practitioner | Admitting: Nurse Practitioner

## 2016-04-15 DIAGNOSIS — Z1231 Encounter for screening mammogram for malignant neoplasm of breast: Secondary | ICD-10-CM

## 2016-09-13 ENCOUNTER — Ambulatory Visit: Payer: 59 | Admitting: Nurse Practitioner

## 2016-09-21 ENCOUNTER — Encounter: Payer: Self-pay | Admitting: Nurse Practitioner

## 2016-09-26 ENCOUNTER — Ambulatory Visit: Payer: 59 | Admitting: Nurse Practitioner

## 2016-09-28 ENCOUNTER — Ambulatory Visit (INDEPENDENT_AMBULATORY_CARE_PROVIDER_SITE_OTHER): Payer: 59 | Admitting: Cardiovascular Disease

## 2016-09-28 ENCOUNTER — Encounter: Payer: Self-pay | Admitting: Cardiovascular Disease

## 2016-09-28 VITALS — BP 146/100 | HR 72 | Ht 65.25 in | Wt 216.8 lb

## 2016-09-28 DIAGNOSIS — I10 Essential (primary) hypertension: Secondary | ICD-10-CM

## 2016-09-28 MED ORDER — LOSARTAN POTASSIUM 50 MG PO TABS: 50.0000 mg | ORAL_TABLET | Freq: Every day | ORAL | 11 refills | Status: DC

## 2016-09-28 NOTE — Progress Notes (Signed)
Cardiology Office Note   Date:  09/28/2016   ID:  Ashley Schneider, DOB 03-24-1966, MRN [id number]  PCP:  Denny LevySara Neal, MD  Cardiologist:   Kristeen MissPhilip Nahser, MD   Chief Complaint  Patient presents with  . Follow-up    HTN   Problem List: 1. HTN    History of Present Illness: Ashley Schneider is a 50 y.o. female who presents for HTN. No CP or dyspnea Working out since Oct.  Rare episodes of asthma   Dec. 13, 2017:  Ashley Schneider got married since Iast her. BP is well controlled at home Is elevated here  +  Past Medical History:  Diagnosis Date  . Asthma with bronchitis 08/2013  . Childhood asthma   . Hypertension   . Menorrhagia   . Obesity   . Pregnancy induced hypertension 1995    Past Surgical History:  Procedure Laterality Date  . ENDOMETRIAL ABLATION  2009  . INCISION AND DRAINAGE ABSCESS Left 06/2013   left breast  . TUBAL LIGATION       Current Outpatient Prescriptions  Medication Sig Dispense Refill  . BIOTIN PO Take 50,000 mcg by mouth daily.     . Multiple Vitamin (MULTIVITAMIN WITH MINERALS) TABS tablet Take 1 tablet by mouth 2 (two) times a week. Take on Monday and Thursday    . potassium chloride SA (K-DUR,KLOR-CON) 20 MEQ tablet Take 2 tablets (40 mEq total) by mouth daily. 60 tablet 11  . Vitamin D, Ergocalciferol, (DRISDOL) 50000 units CAPS capsule Take 1 capsule (50,000 Units total) by mouth every 7 (seven) days. 30 capsule 0  . hydrochlorothiazide (HYDRODIURIL) 25 MG tablet Take 1 tablet (25 mg total) by mouth daily. 90 tablet 3   No current facility-administered medications for this visit.     Allergies:   Augmentin [amoxicillin-pot clavulanate]; Other; and Penicillins    Social History:  The patient  reports that she has never smoked. She has never used smokeless tobacco. She reports that she does not drink alcohol or use drugs.   Family History:  The patient's family history includes Diabetes in her father; Diabetic kidney disease in her  maternal uncle; Hyperlipidemia in her brother and brother; Hypertension in her brother, brother, father, and mother; Lung cancer in her maternal uncle; Prostate cancer in her maternal uncle.    ROS:  Please see the history of present illness.    Review of Systems: Constitutional:  denies fever, chills, diaphoresis, appetite change and fatigue.  HEENT: denies photophobia, eye pain, redness, hearing loss, ear pain, congestion, sore throat, rhinorrhea, sneezing, neck pain, neck stiffness and tinnitus.  Respiratory: denies SOB, DOE, cough, chest tightness, and wheezing.  Cardiovascular: denies chest pain, palpitations and leg swelling.  Gastrointestinal: denies nausea, vomiting, abdominal pain, diarrhea, constipation, blood in stool.  Genitourinary: denies dysuria, urgency, frequency, hematuria, flank pain and difficulty urinating.  Musculoskeletal: denies  myalgias, back pain, joint swelling, arthralgias and gait problem.   Skin: denies pallor, rash and wound.  Neurological: denies dizziness, seizures, syncope, weakness, light-headedness, numbness and headaches.   Hematological: denies adenopathy, easy bruising, personal or family bleeding history.  Psychiatric/ Behavioral: denies suicidal ideation, mood changes, confusion, nervousness, sleep disturbance and agitation.       All other systems are reviewed and negative.    PHYSICAL EXAM: VS:  BP (!) 146/100 (BP Location: Left Arm, Patient Position: Sitting, Cuff Size: Large)   Pulse 72   Ht 5' 5.25" (1.657 m)   Wt 216 lb 12.8 oz (98.3  kg)   BMI 35.80 kg/m  , BMI Body mass index is 35.8 kg/m. GEN: Well nourished, well developed, in no acute distress  HEENT: normal  Neck: no JVD, carotid bruits, or masses Cardiac: RRR; no murmurs, rubs, or gallops,no edema  Respiratory:  clear to auscultation bilaterally, normal work of breathing GI: soft, nontender, nondistended, + BS MS: no deformity or atrophy  Skin: warm and dry, no rash Neuro:   Strength and sensation are intact Psych: normal   EKG:  EKG is ordered today. The ekg ordered today demonstrates  :  NSR at 72, normal eCG    Recent Labs: 12/23/2015: ALT 10; BUN 11; Creat 1.03; Hemoglobin 15.0; Platelets 333; Potassium 3.9; Sodium 139; TSH 0.64    Lipid Panel    Component Value Date/Time   CHOL 166 12/23/2015 0942   TRIG 56 12/23/2015 0942   HDL 58 12/23/2015 0942   CHOLHDL 2.9 12/23/2015 0942   VLDL 11 12/23/2015 0942   LDLCALC 97 12/23/2015 0942      Wt Readings from Last 3 Encounters:  09/28/16 216 lb 12.8 oz (98.3 kg)  12/23/15 200 lb (90.7 kg)  04/01/15 215 lb 8 oz (97.8 kg)      Other studies Reviewed: Additional studies/ records that were reviewed today include: . Review of the above records demonstrates:    ASSESSMENT AND PLAN:  1.   Essential hypertension:    BP remains elevated.  Eating more salt than she should  Will decrease her salt intake  Start Losartan  BMP in 3 weeks  Will see her in 3 months  If BP is still elevated, will add Aldactone    Current medicines are reviewed at length with the patient today.  The patient does not have concerns regarding medicines.  The following changes have been made:  no change  Labs/ tests ordered today include:  No orders of the defined types were placed in this encounter.    Disposition:   FU with me in 1 year    Signed, Kristeen MissPhilip Nahser, MD  09/28/2016 4:24 PM    [facility] [address], [place], [place]  [address] Phone: [phone](336) [phone]; Fax: [phone](336) [phone]

## 2016-09-28 NOTE — Patient Instructions (Signed)
Medication Instructions:  START Losartan 50 mg once daily   Labwork: Your physician recommends that you return for lab work in: 3 weeks for basic metabolic panel   Testing/Procedures: None Ordered   Follow-Up: Your physician recommends that you schedule a follow-up appointment in: 3 months with Dr. Nahser.   If you need a refill on your cardiac medications before your next appointment, please call your pharmacy.   Thank you for choosing [facility]! Fredis Malkiewicz, RN [phone]   

## 2016-12-21 ENCOUNTER — Other Ambulatory Visit: Payer: Self-pay

## 2016-12-21 MED ORDER — HYDROCHLOROTHIAZIDE 25 MG PO TABS: 25.0000 mg | ORAL_TABLET | Freq: Every day | ORAL | 0 refills | Status: DC

## 2016-12-28 ENCOUNTER — Encounter: Payer: Self-pay | Admitting: Cardiovascular Disease

## 2017-01-04 ENCOUNTER — Ambulatory Visit (INDEPENDENT_AMBULATORY_CARE_PROVIDER_SITE_OTHER): Payer: 59 | Admitting: Cardiovascular Disease

## 2017-01-04 ENCOUNTER — Ambulatory Visit (INDEPENDENT_AMBULATORY_CARE_PROVIDER_SITE_OTHER): Payer: 59 | Admitting: Nurse Practitioner

## 2017-01-04 ENCOUNTER — Encounter: Payer: Self-pay | Admitting: Cardiovascular Disease

## 2017-01-04 ENCOUNTER — Encounter: Payer: Self-pay | Admitting: Nurse Practitioner

## 2017-01-04 VITALS — BP 140/100 | HR 82 | Ht 65.0 in | Wt 226.4 lb

## 2017-01-04 VITALS — BP 152/90 | HR 72 | Resp 16 | Ht 65.0 in | Wt 225.0 lb

## 2017-01-04 DIAGNOSIS — E559 Vitamin D deficiency, unspecified: Secondary | ICD-10-CM | POA: Diagnosis not present

## 2017-01-04 DIAGNOSIS — Z01419 Encounter for gynecological examination (general) (routine) without abnormal findings: Secondary | ICD-10-CM | POA: Diagnosis not present

## 2017-01-04 DIAGNOSIS — Z0000 Encounter for general adult medical examination without abnormal findings: Secondary | ICD-10-CM

## 2017-01-04 DIAGNOSIS — I10 Essential (primary) hypertension: Secondary | ICD-10-CM

## 2017-01-04 LAB — CBC
HEMATOCRIT: 40.6 % (ref 35.0–45.0)
Hemoglobin: 14.5 g/dL (ref 11.7–15.5)
MCH: 31.2 pg (ref 27.0–33.0)
MCHC: 35.7 g/dL (ref 32.0–36.0)
MCV: 87.3 fL (ref 80.0–100.0)
MPV: 8.9 fL (ref 7.5–12.5)
Platelets: 325 10*3/uL (ref 140–400)
RBC: 4.65 MIL/uL (ref 3.80–5.10)
RDW: 13.4 % (ref 11.0–15.0)
WBC: 6.4 10*3/uL (ref 3.8–10.8)

## 2017-01-04 LAB — COMPREHENSIVE METABOLIC PANEL
ALT: 10 U/L (ref 6–29)
AST: 19 U/L (ref 10–35)
Albumin: 4.3 g/dL (ref 3.6–5.1)
Alkaline Phosphatase: 70 U/L (ref 33–115)
BUN: 10 mg/dL (ref 7–25)
CHLORIDE: 96 mmol/L — AB (ref 98–110)
CO2: 32 mmol/L — AB (ref 20–31)
CREATININE: 0.98 mg/dL (ref 0.50–1.10)
Calcium: 9.6 mg/dL (ref 8.6–10.2)
GLUCOSE: 87 mg/dL (ref 65–99)
Potassium: 3.4 mmol/L — ABNORMAL LOW (ref 3.5–5.3)
SODIUM: 138 mmol/L (ref 135–146)
Total Bilirubin: 0.9 mg/dL (ref 0.2–1.2)
Total Protein: 7.3 g/dL (ref 6.1–8.1)

## 2017-01-04 LAB — LIPID PANEL
CHOLESTEROL: 176 mg/dL (ref <200)
HDL: 47 mg/dL — AB (ref >50)
LDL Cholesterol: 75 mg/dL (ref <100)
TRIGLYCERIDES: 271 mg/dL — AB (ref <150)
Total CHOL/HDL Ratio: 3.7 Ratio (ref <5.0)
VLDL: 54 mg/dL — AB (ref <30)

## 2017-01-04 LAB — TSH: TSH: 0.91 m[IU]/L

## 2017-01-04 NOTE — Patient Instructions (Signed)

## 2017-01-04 NOTE — Progress Notes (Signed)
Cardiology Office Note   Date:  01/04/2017   ID:  RIFKA RAMEY, DOB 03-Aug-1966, MRN [id number]  PCP:  No PCP Per Patient  Cardiologist:   Ashley Miss, MD   Chief Complaint  Patient presents with  . Hypertension   Problem List: 1. HTN    History of Present Illness: Ashley Schneider is a 51 y.o. female who presents for HTN. No CP or dyspnea Working out since Oct.  Rare episodes of asthma   Dec. 13, 2017:  Ashley Schneider got married since Iast her. BP is well controlled at home Is elevated here    January 04, 2017:  Ashley Schneider is seen today for Follow-up of her hypertension. She brought her blood pressure log and her blood pressure readings at home are very well-controlled. Her blood pressures typically elevated when she comes to the office.  Avoiding salt.   Exercising some , going to the gym  No CP or dyspnea   Past Medical History:  Diagnosis Date  . Asthma with bronchitis 08/2013  . Childhood asthma   . Hypertension   . Menorrhagia   . Obesity   . Pregnancy induced hypertension 1995    Past Surgical History:  Procedure Laterality Date  . ENDOMETRIAL ABLATION  2009  . INCISION AND DRAINAGE ABSCESS Left 06/2013   left breast  . TUBAL LIGATION       Current Outpatient Prescriptions  Medication Sig Dispense Refill  . BIOTIN PO Take 50,000 mcg by mouth daily.     . hydrochlorothiazide (HYDRODIURIL) 25 MG tablet Take 1 tablet (25 mg total) by mouth daily. 90 tablet 0  . Multiple Vitamin (MULTIVITAMIN WITH MINERALS) TABS tablet Take 1 tablet by mouth 2 (two) times a week. Take on Monday and Thursday    . potassium chloride SA (K-DUR,KLOR-CON) 20 MEQ tablet Take 2 tablets (40 mEq total) by mouth daily. 60 tablet 11  . Vitamin D, Ergocalciferol, (DRISDOL) 50000 units CAPS capsule Take 1 capsule (50,000 Units total) by mouth every 7 (seven) days. 30 capsule 0   No current facility-administered medications for this visit.     Allergies:   Augmentin [amoxicillin-pot  clavulanate]; Other; and Penicillins    Social History:  The patient  reports that she has never smoked. She has never used smokeless tobacco. She reports that she does not drink alcohol or use drugs.   Family History:  The patient's family history includes Diabetes in her father; Diabetic kidney disease in her maternal uncle; Hyperlipidemia in her brother and brother; Hypertension in her brother, brother, father, and mother; Lung cancer in her maternal uncle; Prostate cancer in her maternal uncle.    ROS:  Please see the history of present illness.    Review of Systems: Constitutional:  denies fever, chills, diaphoresis, appetite change and fatigue.  HEENT: denies photophobia, eye pain, redness, hearing loss, ear pain, congestion, sore throat, rhinorrhea, sneezing, neck pain, neck stiffness and tinnitus.  Respiratory: denies SOB, DOE, cough, chest tightness, and wheezing.  Cardiovascular: denies chest pain, palpitations and leg swelling.  Gastrointestinal: denies nausea, vomiting, abdominal pain, diarrhea, constipation, blood in stool.  Genitourinary: denies dysuria, urgency, frequency, hematuria, flank pain and difficulty urinating.  Musculoskeletal: denies  myalgias, back pain, joint swelling, arthralgias and gait problem.   Skin: denies pallor, rash and wound.  Neurological: denies dizziness, seizures, syncope, weakness, light-headedness, numbness and headaches.   Hematological: denies adenopathy, easy bruising, personal or family bleeding history.  Psychiatric/ Behavioral: denies suicidal ideation, mood changes, confusion, nervousness,  sleep disturbance and agitation.       All other systems are reviewed and negative.    PHYSICAL EXAM: VS:  BP (!) 140/100 (BP Location: Right Arm, Patient Position: Sitting, Cuff Size: Large)   Pulse 82   Ht 5\' 5"  (1.651 m)   Wt 226 lb 6.4 oz (102.7 kg)   LMP 12/30/2016   SpO2 94%   BMI 37.67 kg/m  , BMI Body mass index is 37.67 kg/m. GEN:  Well nourished, well developed, in no acute distress  HEENT: normal  Neck: no JVD, carotid bruits, or masses, supple  Cardiac: reg. Rate  no murmurs, rubs, or gallops,no edema  Respiratory:  clear to auscultation  , normal work of breathing GI: soft, nontender, nondistended, + BS, mild obesity   MS: no deformity or atrophy  Skin: warm and dry, no rash Neuro:  Strength and sensation are intact Psych: Normal   EKG:  EKG is not ordered today.    Recent Labs: No results found for requested labs within last 8760 hours.    Lipid Panel    Component Value Date/Time   CHOL 166 12/23/2015 0942   TRIG 56 12/23/2015 0942   HDL 58 12/23/2015 0942   CHOLHDL 2.9 12/23/2015 0942   VLDL 11 12/23/2015 0942   LDLCALC 97 12/23/2015 0942      Wt Readings from Last 3 Encounters:  01/04/17 226 lb 6.4 oz (102.7 kg)  01/04/17 225 lb (102.1 kg)  09/28/16 216 lb 12.8 oz (98.3 kg)      Other studies Reviewed: Additional studies/ records that were reviewed today include: . Review of the above records demonstrates:    ASSESSMENT AND PLAN:  1.   Essential hypertension:    BP remains elevated.  Eating more salt than she should  Will decrease her salt intake  Start Losartan  BMP in 3 weeks  Will see her in 3 months  If BP is still elevated, will add Aldactone    Current medicines are reviewed at length with the patient today.  The patient does not have concerns regarding medicines.  The following changes have been made:  no change  Labs/ tests ordered today include:  No orders of the defined types were placed in this encounter.    Disposition:   FU with me in 1 year    Signed, Ashley MissPhilip Alaynna Kerwood, MD  01/04/2017 1:43 PM    [facility] [address], [place], [place]  [address] Phone: [phone](336) [phone]; Fax: [phone]([phone]

## 2017-01-04 NOTE — Progress Notes (Signed)
51 y.o. [id number] Married African American Fe here for annual exam. Got  married 04/2016.  Menses light for 2-3 days.  She had no menses November - January.  Then February and March for 1- 2 day.  Some cravings but no cramps or PMS.  Really enjoys being married.  Children are merging together nicely.  Patient's last menstrual period was 12/30/2016.          Sexually active: Yes.    The current method of family planning is ablation.    Exercising: Yes.    cardio, strength Smoker:  no  Health Maintenance: Pap:  12/22/14, Negative with neg HR HPV          12/12/11, negative with neg HR HPV MMG:  04/15/16 BIRADS 1 negative/density c TDaP:  11/18/2010 HIV: 12/23/15 Labs: labs drawn today   reports that she has never smoked. She has never used smokeless tobacco. She reports that she does not drink alcohol or use drugs.  Past Medical History:  Diagnosis Date  . Asthma with bronchitis 08/2013  . Childhood asthma   . Hypertension   . Menorrhagia   . Obesity   . Pregnancy induced hypertension 1995    Past Surgical History:  Procedure Laterality Date  . ENDOMETRIAL ABLATION  2009  . INCISION AND DRAINAGE ABSCESS Left 06/2013   left breast  . TUBAL LIGATION      Current Outpatient Prescriptions  Medication Sig Dispense Refill  . BIOTIN PO Take 50,000 mcg by mouth daily.     . hydrochlorothiazide (HYDRODIURIL) 25 MG tablet Take 1 tablet (25 mg total) by mouth daily. 90 tablet 0  . Multiple Vitamin (MULTIVITAMIN WITH MINERALS) TABS tablet Take 1 tablet by mouth 2 (two) times a week. Take on Monday and Thursday    . potassium chloride SA (K-DUR,KLOR-CON) 20 MEQ tablet Take 2 tablets (40 mEq total) by mouth daily. 60 tablet 11  . Vitamin D, Ergocalciferol, (DRISDOL) 50000 units CAPS capsule Take 1 capsule (50,000 Units total) by mouth every 7 (seven) days. 30 capsule 0   No current facility-administered medications for this visit.     Family History  Problem Relation Age of Onset  . Hypertension  Mother   . Hypertension Father   . Diabetes Father   . Lung cancer Maternal Uncle   . Hypertension Brother   . Hyperlipidemia Brother   . Hypertension Brother   . Hyperlipidemia Brother   . Prostate cancer Maternal Uncle   . Diabetic kidney disease Maternal Uncle     ROS:  Pertinent items are noted in HPI.  Otherwise, a comprehensive ROS was negative.  Exam:   BP (!) 152/90 (BP Location: Right Arm, Patient Position: Sitting, Cuff Size: Large)   Pulse 72   Resp 16   Ht 5\' 5"  (1.651 m)   Wt 225 lb (102.1 kg)   LMP 12/30/2016   BMI 37.44 kg/m  Height: 5\' 5"  (165.1 cm) Ht Readings from Last 3 Encounters:  01/04/17 5\' 5"  (1.651 m)  09/28/16 5' 5.25" (1.657 m)  12/23/15 5' 5.25" (1.657 m)    General appearance: alert, cooperative and appears stated age Head: Normocephalic, without obvious abnormality, atraumatic Neck: no adenopathy, supple, symmetrical, trachea midline and thyroid normal to inspection and palpation Lungs: clear to auscultation bilaterally Breasts: normal appearance, no masses or tenderness Heart: regular rate and rhythm Abdomen: soft, non-tender; no masses,  no organomegaly Extremities: extremities normal, atraumatic, no cyanosis or edema Skin: Skin color, texture, turgor normal. No rashes or  lesions Lymph nodes: Cervical, supraclavicular, and axillary nodes normal. No abnormal inguinal nodes palpated Neurologic: Grossly normal   Pelvic: External genitalia:  no lesions              Urethra:  normal appearing urethra with no masses, tenderness or lesions              Bartholin's and Skene's: normal                 Vagina: normal appearing vagina with normal color and discharge, no lesions              Cervix: anteverted              Pap taken: Yes.   Bimanual Exam:  Uterus:  normal size, contour, position, consistency, mobility, non-tender              Adnexa: no mass, fullness, tenderness               Rectovaginal: Confirms               Anus:  normal  sphincter tone, no lesions  Chaperone present: yes  A:  Well Woman with normal exam    S/P BTL and HTA 03/07/08             HTN - on med's with elevated BP today (white coat HTN)    P:   Reviewed health and wellness pertinent to exam  Pap smear as above  Mammogram is due 03/2017  Follow with labs  Counseled on breast self exam, mammography screening, adequate intake of calcium and vitamin D, diet and exercise, Kegel's exercises return annually or prn  An After Visit Summary was printed and given to the patient.

## 2017-01-04 NOTE — Patient Instructions (Signed)
Medication Instructions:  Your physician recommends that you continue on your current medications as directed. Please refer to the Current Medication list given to you today.   Labwork: None Ordered   Testing/Procedures: None Ordered   Follow-Up: Your physician wants you to follow-up in: 6 months with Dr. Nahser.  You will receive a reminder letter in the mail two months in advance. If you don't receive a letter, please call our office to schedule the follow-up appointment.   If you need a refill on your cardiac medications before your next appointment, please call your pharmacy.   Thank you for choosing [facility]! Riely Baskett, RN [phone]    

## 2017-01-05 LAB — HEMOGLOBIN A1C
HEMOGLOBIN A1C: 4.9 % (ref <5.7)
MEAN PLASMA GLUCOSE: 94 mg/dL

## 2017-01-05 LAB — VITAMIN D 25 HYDROXY (VIT D DEFICIENCY, FRACTURES): Vit D, 25-Hydroxy: 34 ng/mL (ref 30–100)

## 2017-01-05 NOTE — Progress Notes (Signed)
Encounter reviewed by Dr. Brook Amundson C. Silva.  

## 2017-01-06 LAB — IPS PAP TEST WITH HPV

## 2017-02-01 ENCOUNTER — Telehealth: Payer: Self-pay | Admitting: Nurse Practitioner

## 2017-02-01 NOTE — Telephone Encounter (Signed)
Pt needs pap to be associated with a normal GYN code for screening for insurance coverage,  The pap was sent in for normal screening. But the office visit code needs to GYN exam with normal findings instead of well woman exam with code of Z01.419.  This will be sent to [facility] for resubmitting.  Baird Lyons has called and is having [facility] to resubmit to insurance.

## 2017-03-14 ENCOUNTER — Other Ambulatory Visit: Payer: Self-pay | Admitting: Cardiovascular Disease

## 2017-03-23 ENCOUNTER — Other Ambulatory Visit: Payer: Self-pay | Admitting: *Deleted

## 2017-03-23 MED ORDER — HYDROCHLOROTHIAZIDE 25 MG PO TABS: 25.0000 mg | ORAL_TABLET | Freq: Every day | ORAL | 2 refills | Status: DC

## 2017-03-27 ENCOUNTER — Other Ambulatory Visit: Payer: Self-pay | Admitting: Nurse Practitioner

## 2017-03-27 DIAGNOSIS — Z1231 Encounter for screening mammogram for malignant neoplasm of breast: Secondary | ICD-10-CM

## 2017-04-21 ENCOUNTER — Ambulatory Visit
Admission: RE | Admit: 2017-04-21 | Discharge: 2017-04-21 | Disposition: A | Discharge status: Home / Self Care | Payer: 59 | Source: Ambulatory Visit | Attending: Nurse Practitioner | Admitting: Nurse Practitioner

## 2017-04-21 DIAGNOSIS — Z1231 Encounter for screening mammogram for malignant neoplasm of breast: Secondary | ICD-10-CM

## 2017-05-17 ENCOUNTER — Telehealth: Payer: Self-pay | Admitting: Obstetrics and Gynecology

## 2017-05-17 NOTE — Telephone Encounter (Signed)
Left patient a message to call back to reschedule a future appointment that was cancelled by the provider for AEX. °

## 2018-01-01 ENCOUNTER — Ambulatory Visit (INDEPENDENT_AMBULATORY_CARE_PROVIDER_SITE_OTHER): Payer: 59 | Admitting: Cardiovascular Disease

## 2018-01-01 ENCOUNTER — Telehealth: Payer: Self-pay | Admitting: Cardiovascular Disease

## 2018-01-01 ENCOUNTER — Encounter: Payer: Self-pay | Admitting: Cardiovascular Disease

## 2018-01-01 VITALS — BP 166/112 | HR 76 | Ht 66.0 in | Wt 223.8 lb

## 2018-01-01 DIAGNOSIS — I10 Essential (primary) hypertension: Secondary | ICD-10-CM

## 2018-01-01 LAB — TSH: TSH: 0.716 u[IU]/mL (ref 0.450–4.500)

## 2018-01-01 LAB — CBC
HEMOGLOBIN: 14.3 g/dL (ref 11.1–15.9)
Hematocrit: 39.8 % (ref 34.0–46.6)
MCH: 31.8 pg (ref 26.6–33.0)
MCHC: 35.9 g/dL — AB (ref 31.5–35.7)
MCV: 88 fL (ref 79–97)
Platelets: 329 10*3/uL (ref 150–379)
RBC: 4.5 x10E6/uL (ref 3.77–5.28)
RDW: 13 % (ref 12.3–15.4)
WBC: 5.3 10*3/uL (ref 3.4–10.8)

## 2018-01-01 LAB — BASIC METABOLIC PANEL
BUN/Creatinine Ratio: 9 (ref 9–23)
BUN: 9 mg/dL (ref 6–24)
CALCIUM: 10 mg/dL (ref 8.7–10.2)
CO2: 26 mmol/L (ref 20–29)
Chloride: 95 mmol/L — ABNORMAL LOW (ref 96–106)
Creatinine, Ser: 0.95 mg/dL (ref 0.57–1.00)
GFR, EST AFRICAN AMERICAN: 81 mL/min/{1.73_m2} (ref >59)
GFR, EST NON AFRICAN AMERICAN: 70 mL/min/{1.73_m2} (ref >59)
Glucose: 87 mg/dL (ref 65–99)
POTASSIUM: 3.6 mmol/L (ref 3.5–5.2)
Sodium: 138 mmol/L (ref 134–144)

## 2018-01-01 MED ORDER — POTASSIUM CHLORIDE CRYS ER 20 MEQ PO TBCR: 20.0000 meq | EXTENDED_RELEASE_TABLET | Freq: Every day | ORAL | 3 refills | Status: DC

## 2018-01-01 MED ORDER — SPIRONOLACTONE 25 MG PO TABS: 25.0000 mg | ORAL_TABLET | Freq: Every day | ORAL | 3 refills | Status: DC

## 2018-01-01 NOTE — Patient Instructions (Signed)
Medication Instructions:  Your physician has recommended you make the following change in your medication: DECREASE Kdur (potassium chloride) to 20 mEq once daily START Aldactone (Spironolactone) 25 mg once daily   Labwork: TODAY - CBC, TSH, BMET   Testing/Procedures: None Ordered   Follow-Up: Your physician recommends that you schedule a follow-up appointment in: 2 weeks with APP on Dr. Harvie BridgeNahser's team   If you need a refill on your cardiac medications before your next appointment, please call your pharmacy.   Thank you for choosing [facility]! Eligha BridegroomMichelle Swinyer, RN [phone]

## 2018-01-01 NOTE — Progress Notes (Addendum)
Cardiology Office Note   Date:  3/Schneider/2019   ID:  Ashley Schneider, DOB [birth year], MRN [id number]  PCP:  Patient, No Pcp Per  Cardiologist:   Kristeen Miss, MD   Chief Complaint  Patient presents with  . Hypertension   Problem List: 1. HTN    History of Present Illness: Ashley Schneider is a 52 y.o. female who presents for HTN. No CP or dyspnea Working out since Oct.  Rare episodes of asthma   Dec. 13, 2017:  Ashley Schneider got married since Iast her. BP is well controlled at home Is elevated here    Ashley 21, 2018:  Ashley Schneider is seen today for Follow-up of her hypertension. She brought her blood pressure log and her blood pressure readings at home are very well-controlled. Her blood pressures typically elevated when she comes to the office.  Avoiding salt.   Exercising some , going to the gym  No CP or dyspnea   Ashley Schneider, Ashley Schneider  ( pronouced Sone-ya) is seen today for work in  eval of her HTN .  Has been eating more salt than she should.  Had a salty meal on Saturday night  At [facility] an extra HCTZ yesterday and today   Has had a headache    Past Medical History:  Diagnosis Date  . Asthma with bronchitis 08/2013  . Childhood asthma   . Hypertension   . Menorrhagia   . Obesity   . Pregnancy induced hypertension 1995    Past Surgical History:  Procedure Laterality Date  . ENDOMETRIAL ABLATION  2009  . INCISION AND DRAINAGE ABSCESS Left 06/2013   left breast  . TUBAL LIGATION       Current Outpatient Medications  Medication Sig Dispense Refill  . BIOTIN PO Take 50,000 mcg by mouth daily.     . hydrochlorothiazide (HYDRODIURIL) 25 MG tablet Take 1 tablet (25 mg total) by mouth daily. 90 tablet 2  . Multiple Vitamin (MULTIVITAMIN WITH MINERALS) TABS tablet Take 1 tablet by mouth 2 (two) times a week. Take on Monday and Thursday    . potassium chloride SA (K-DUR,KLOR-CON) 20 MEQ tablet Take 2 tablets (40 mEq total) by mouth daily. 60  tablet 11   No current facility-administered medications for this visit.     Allergies:   Augmentin [amoxicillin-pot clavulanate]; Other; and Penicillins    Social History:  The patient  reports that  has never smoked. she has never used smokeless tobacco. She reports that she does not drink alcohol or use drugs.   Family History:  The patient's family history includes Diabetes in her father; Diabetic kidney disease in her maternal uncle; Hyperlipidemia in her brother and brother; Hypertension in her brother, brother, father, and mother; Lung cancer in her maternal uncle; Prostate cancer in her maternal uncle.    ROS:  Please see the history of present illness.    Physical Exam: Blood pressure (!) 166/112, pulse 76, height 5\' 6"  (1.676 m), weight 223 lb 12.8 oz (101.5 kg).  GEN:  Well nourished, well developed in no acute distress HEENT: Normal NECK: No JVD; No carotid bruits LYMPHATICS: No lymphadenopathy CARDIAC: RR  RESPIRATORY:  Clear to auscultation without rales, wheezing or rhonchi  ABDOMEN: Soft, non-tender, non-distended MUSCULOSKELETAL:  No edema; No deformity  SKIN: Warm and dry NEUROLOGIC:  Alert and oriented x 3   EKG: Ashley Schneider, 2019: Normal sinus rhythm at 76.  EKG is otherwise normal.    Recent Labs:  01/04/2017: ALT 10; BUN 10; Creat 0.98; Hemoglobin 14.5; Platelets 325; Potassium 3.4; Sodium 138; TSH 0.91    Lipid Panel    Component Value Date/Time   CHOL 176 01/04/2017 1004   TRIG 271 (H) 01/04/2017 1004   HDL 47 (L) 01/04/2017 1004   CHOLHDL 3.7 01/04/2017 1004   VLDL 54 (H) 01/04/2017 1004   LDLCALC 75 01/04/2017 1004      Wt Readings from Last 3 Encounters:  03/Schneider/19 223 lb 12.8 oz (101.5 kg)  03/21/Schneider 226 lb 6.4 oz (102.7 kg)  03/21/Schneider 225 lb (102.1 kg)      Other studies Reviewed: Additional studies/ records that were reviewed today include: . Review of the above records demonstrates:    ASSESSMENT AND PLAN:  1.   Essential  hypertension:    Ashley Schneider presents with markedly elevated blood pressure.  She is been eating more salt than she should.  Her potassium levels have been persistently low.  I suspect that she has high renin level. We will add Aldactone 25 mg a day. Reduce Kdur to 20 meq a day   Check labs today ( BMP, TSH, CBC) Check BMP in 1 week    Current medicines are reviewed at length with the patient today.  The patient does not have concerns regarding medicines.  The following changes have been made:  no change  Labs/ tests ordered today include:  No orders of the defined types were placed in this encounter.    Disposition:   To follow up with an APP in 2 weeks     Signed, Kristeen MissPhilip Babe Clenney, MD  3/Schneider/2019 10:01 AM    [facility] [address], [place], [place]  [address] Phone: [phone]; Fax: [phone]

## 2018-01-01 NOTE — Telephone Encounter (Signed)
New message    Patient went to Urgent care with severe headache - patient states her dinner had 2600 mg of sodium in it   Pt c/o BP issue: STAT if pt c/o blurred vision, one-sided weakness or slurred speech  1. What are your last 5 BP readings? 177/115  2. Are you having any other symptoms (ex. Dizziness, headache, blurred vision, passed out)?  Severe headache   3. What is your BP issue? Urgent care said for her to call and follow up with you

## 2018-01-01 NOTE — Telephone Encounter (Signed)
Spoke with patient who called to report severe headache and elevated BP yesterday. She reports she went out for dinner on Saturday night, got up Sunday morning with severe h/a and then checked the sodium content of the meal she ate Saturday night. She reports her meal contained 2600 mg of sodium. She currently takes HCTZ for HTN. She is due for 1 year follow-up. I offered her an appointment with Dr. Elease HashimotoNahser for today and she commits to come in. She thanked me for the call.

## 2018-01-02 NOTE — Addendum Note (Signed)
Addended by: Jacqlyn KraussOBERTSON, Kalib Bhagat on: 01/02/2018 07:45 AM   Modules accepted: Orders

## 2018-01-04 ENCOUNTER — Other Ambulatory Visit: Payer: Self-pay

## 2018-01-04 ENCOUNTER — Telehealth: Payer: Self-pay | Admitting: Cardiovascular Disease

## 2018-01-04 ENCOUNTER — Ambulatory Visit: Payer: 59 | Admitting: Physician Assistant

## 2018-01-04 ENCOUNTER — Encounter: Payer: Self-pay | Admitting: Obstetrics and Gynecology

## 2018-01-04 ENCOUNTER — Ambulatory Visit (INDEPENDENT_AMBULATORY_CARE_PROVIDER_SITE_OTHER): Payer: 59 | Admitting: Obstetrics and Gynecology

## 2018-01-04 VITALS — BP 150/98 | HR 76 | Resp 16 | Ht 65.0 in | Wt 222.0 lb

## 2018-01-04 DIAGNOSIS — Z0000 Encounter for general adult medical examination without abnormal findings: Secondary | ICD-10-CM

## 2018-01-04 DIAGNOSIS — Z1211 Encounter for screening for malignant neoplasm of colon: Secondary | ICD-10-CM | POA: Diagnosis not present

## 2018-01-04 DIAGNOSIS — Z01419 Encounter for gynecological examination (general) (routine) without abnormal findings: Secondary | ICD-10-CM

## 2018-01-04 DIAGNOSIS — N914 Secondary oligomenorrhea: Secondary | ICD-10-CM

## 2018-01-04 MED ORDER — MEDROXYPROGESTERONE ACETATE 5 MG PO TABS
ORAL_TABLET | ORAL | 1 refills | Status: DC
Start: 2018-01-04 — End: 2020-04-29

## 2018-01-04 MED ORDER — AMLODIPINE BESYLATE 5 MG PO TABS: 5.0000 mg | ORAL_TABLET | Freq: Every day | ORAL | 11 refills | Status: DC

## 2018-01-04 NOTE — Progress Notes (Signed)
52 y.o. [id number] MarriedAfrican AmericanF here for annual exam.  She got married in 7/17, after that her cycles started getting irregular. In the last year she has had 3 cycles. She had an endometrial ablation in 2009. Ever since then she only gets one day of spotting with her cycle. No signs of her cycle coming on. No hot flashes, no night sweats. No vaginal dryness. No dyspareunia. No postcoital bleeding.  Prior to getting married she had been alone for 14 years.  Period Duration (Days): 1 -2 days of spotting  Period Pattern: (!) Irregular Menstrual Flow: Light Menstrual Control: Panty liner Dysmenorrhea: None  Patient's last menstrual period was 12/30/2017.          Sexually active: Yes.    The current method of family planning is none.    Exercising: Yes.    cardio/ strength training  Smoker:  no  Health Maintenance: Pap:  01-04-17 WNL NEG HR HPV 12-22-14 WNL NEG HR HPV History of abnormal Pap:  no MMG:  04-21-17 WNL  Colonoscopy:  Never BMD:   Never TDaP:  11/18/10 Gardasil: N/A   reports that she has never smoked. She has never used smokeless tobacco. She reports that she does not drink alcohol or use drugs. She has 2 kids, son is 20, daughter is 49. Doreene Adas is 13.  She works for an [employer].  Involved in [place]  Past Medical History:  Diagnosis Date  . Asthma with bronchitis 08/2013  . Childhood asthma   . Hypertension   . Menorrhagia   . Obesity   . Pregnancy induced hypertension 1995    Past Surgical History:  Procedure Laterality Date  . ENDOMETRIAL ABLATION  2009  . INCISION AND DRAINAGE ABSCESS Left 06/2013   left breast  . TUBAL LIGATION      Current Outpatient Medications  Medication Sig Dispense Refill  . BIOTIN PO Take 50,000 mcg by mouth daily.     . Multiple Vitamin (MULTIVITAMIN WITH MINERALS) TABS tablet Take 1 tablet by mouth 2 (two) times a week. Take on Monday and Thursday    . potassium chloride SA (K-DUR,KLOR-CON) 20 MEQ tablet Take 1 tablet  (20 mEq total) by mouth daily. 90 tablet 3  . spironolactone (ALDACTONE) 25 MG tablet Take 1 tablet (25 mg total) by mouth daily. 90 tablet 3   No current facility-administered medications for this visit.     Family History  Problem Relation Age of Onset  . Hypertension Mother   . Hypertension Father   . Diabetes Father   . Lung cancer Maternal Uncle   . Hypertension Brother   . Hyperlipidemia Brother   . Hypertension Brother   . Hyperlipidemia Brother   . Prostate cancer Maternal Uncle   . Diabetic kidney disease Maternal Uncle     Review of Systems  Constitutional: Negative.   HENT: Negative.   Eyes: Negative.   Respiratory: Negative.   Cardiovascular: Negative.   Gastrointestinal: Negative.   Endocrine: Negative.   Genitourinary: Negative.   Musculoskeletal: Negative.   Skin: Negative.   Allergic/Immunologic: Negative.   Neurological: Negative.   Psychiatric/Behavioral: Negative.     Exam:   BP (!) 150/98 (BP Location: Right Arm, Patient Position: Sitting, Cuff Size: Large)   Pulse 76   Resp 16   Ht 5\' 5"  (1.651 m)   Wt 222 lb (100.7 kg)   LMP 12/30/2017   BMI 36.94 kg/m   Weight change: @WEIGHTCHANGE @ Height:   Height: 5\' 5"  (165.1 cm)  Ht Readings from Last 3 Encounters:  01/04/18 5\' 5"  (1.651 m)  01/01/18 5\' 6"  (1.676 m)  01/04/17 5\' 5"  (1.651 m)    General appearance: alert, cooperative and appears stated age Head: Normocephalic, without obvious abnormality, atraumatic Neck: no adenopathy, supple, symmetrical, trachea midline and thyroid normal to inspection and palpation Lungs: clear to auscultation bilaterally Cardiovascular: regular rate and rhythm Breasts: normal appearance, no masses or tenderness Abdomen: soft, non-tender; non distended,  no masses,  no organomegaly Extremities: extremities normal, atraumatic, no cyanosis or edema Skin: Skin color, texture, turgor normal. No rashes or lesions Lymph nodes: Cervical, supraclavicular, and axillary  nodes normal. No abnormal inguinal nodes palpated Neurologic: Grossly normal   Pelvic: External genitalia:  no lesions              Urethra:  normal appearing urethra with no masses, tenderness or lesions              Bartholins and Skenes: normal                 Vagina: normal appearing vagina with normal color and discharge, no lesions              Cervix: no lesions               Bimanual Exam:  Uterus:  normal size, contour, position, consistency, mobility, non-tender              Adnexa: no mass, fullness, tenderness               Rectovaginal: Confirms               Anus:  normal sphincter tone, no lesions  Chaperone was present for exam.  A:  Well Woman with normal exam  H/O endometrial ablation  Oligomenorrhea, c/w perimenopause  P:   Cologuard  No pap this year  [facility] this [facility]  CMP and Lipids are due (recent CBC and TSH)  Cyclic provera  Discussed breast self exam  Discussed calcium and vit D intake

## 2018-01-04 NOTE — Telephone Encounter (Signed)
Patient calling, states that she is having some BP issues. Patient was having some phone connection issues, which made difficult to hear but she did mention that her BP was going up and down.

## 2018-01-04 NOTE — Telephone Encounter (Signed)
Left message for patient to call back  

## 2018-01-04 NOTE — Telephone Encounter (Signed)
Spoke with patient who states she continues to have BP of 150/98 mmHg with headaches and blurred vision. She states she has not been to work since she saw Dr. Elease HashimotoNahser on Monday. She states she saw her gynecologist today and BP was 150/98 mmHg, same reading she is getting at home.  She states Dr. Oscar LaJertson thinks her missed menstrual cycles may be contributing to the HTN. I advised that I will review with Dr. Excell Seltzerooper since Dr. Elease HashimotoNahser is out of the office and will call her back with his advice. She states she really needs to return to work and cannot work with current headaches. She verbalized understanding and agreement with plan.

## 2018-01-04 NOTE — Telephone Encounter (Signed)
Reviewed patient's concern with Dr. Excell Seltzerooper, DOD. He advised patient start Amlodipine 5 mg once daily. I advised patient to take this in addition to her aldactone 25 mg and Kdur daily in the morning. I advised her to call back with questions or concerns prior to f/u on 4/4 with Dr. Elease HashimotoNahser. She verbalized understanding and agreement and thanked me for the call.

## 2018-01-04 NOTE — Patient Instructions (Signed)

## 2018-01-05 ENCOUNTER — Ambulatory Visit: Payer: 59 | Admitting: Nurse Practitioner

## 2018-01-05 LAB — COMPREHENSIVE METABOLIC PANEL
ALT: 11 IU/L (ref 0–32)
AST: 20 IU/L (ref 0–40)
Albumin/Globulin Ratio: 1.6 (ref 1.2–2.2)
Albumin: 4.8 g/dL (ref 3.5–5.5)
Alkaline Phosphatase: 83 IU/L (ref 39–117)
BUN/Creatinine Ratio: 16 (ref 9–23)
BUN: 15 mg/dL (ref 6–24)
Bilirubin Total: 0.6 mg/dL (ref 0.0–1.2)
CALCIUM: 9.5 mg/dL (ref 8.7–10.2)
CHLORIDE: 100 mmol/L (ref 96–106)
CO2: 24 mmol/L (ref 20–29)
Creatinine, Ser: 0.94 mg/dL (ref 0.57–1.00)
GFR calc Af Amer: 82 mL/min/{1.73_m2} (ref >59)
GFR, EST NON AFRICAN AMERICAN: 71 mL/min/{1.73_m2} (ref >59)
GLOBULIN, TOTAL: 3 g/dL (ref 1.5–4.5)
Glucose: 85 mg/dL (ref 65–99)
POTASSIUM: 4.1 mmol/L (ref 3.5–5.2)
SODIUM: 141 mmol/L (ref 134–144)
Total Protein: 7.8 g/dL (ref 6.0–8.5)

## 2018-01-05 LAB — FOLLICLE STIMULATING HORMONE: FSH: 7.9 m[IU]/mL

## 2018-01-05 LAB — LIPID PANEL
CHOL/HDL RATIO: 3.4 ratio (ref 0.0–4.4)
Cholesterol, Total: 175 mg/dL (ref 100–199)
HDL: 51 mg/dL (ref >39)
LDL Calculated: 102 mg/dL — ABNORMAL HIGH (ref 0–99)
TRIGLYCERIDES: 112 mg/dL (ref 0–149)
VLDL Cholesterol Cal: 22 mg/dL (ref 5–40)

## 2018-01-18 ENCOUNTER — Ambulatory Visit (INDEPENDENT_AMBULATORY_CARE_PROVIDER_SITE_OTHER): Payer: 59 | Admitting: Cardiovascular Disease

## 2018-01-18 ENCOUNTER — Encounter: Payer: Self-pay | Admitting: Cardiovascular Disease

## 2018-01-18 VITALS — BP 134/94 | HR 77 | Wt 222.0 lb

## 2018-01-18 DIAGNOSIS — I10 Essential (primary) hypertension: Secondary | ICD-10-CM | POA: Diagnosis not present

## 2018-01-18 LAB — BASIC METABOLIC PANEL
BUN / CREAT RATIO: 11 (ref 9–23)
BUN: 11 mg/dL (ref 6–24)
CO2: 22 mmol/L (ref 20–29)
Calcium: 10.1 mg/dL (ref 8.7–10.2)
Chloride: 102 mmol/L (ref 96–106)
Creatinine, Ser: 1.04 mg/dL — ABNORMAL HIGH (ref 0.57–1.00)
GFR calc Af Amer: 72 mL/min/{1.73_m2} (ref >59)
GFR, EST NON AFRICAN AMERICAN: 63 mL/min/{1.73_m2} (ref >59)
Glucose: 97 mg/dL (ref 65–99)
POTASSIUM: 4.3 mmol/L (ref 3.5–5.2)
SODIUM: 138 mmol/L (ref 134–144)

## 2018-01-18 NOTE — Patient Instructions (Signed)
Medication Instructions:  Your physician recommends that you continue on your current medications as directed. Please refer to the Current Medication list given to you today.   Labwork: TODAY - basic metabolic panel   Testing/Procedures: None Ordered   Follow-Up: Your physician recommends that you schedule a follow-up appointment in: 3 months with Dr. Nahser   If you need a refill on your cardiac medications before your next appointment, please call your pharmacy.   Thank you for choosing [facility]! Halden Phegley, RN [phone]    

## 2018-01-18 NOTE — Progress Notes (Signed)
Cardiology Office Note   Date:  01/18/2018   ID:  Ashley Schneider, DOB Jul 16, 1966, MRN [id number]  PCP:  Patient, No Pcp Per  Cardiologist:   Kristeen Miss, MD   Chief Complaint  Patient presents with  . Hypertension   Problem List: 1. HTN  Ashley Schneider is a 52 y.o. female who presents for HTN. No CP or dyspnea Working out since Oct.  Rare episodes of asthma   Dec. 13, 2017:  Debbera got married since Iast her. BP is well controlled at home Is elevated here    January 04, 2017:  Ashley Schneider is seen today for Follow-up of her hypertension. She brought her blood pressure log and her blood pressure readings at home are very well-controlled. Her blood pressures typically elevated when she comes to the office.  Avoiding salt.   Exercising some , going to the gym  No CP or dyspnea   January 01, 2018: Ashley Schneider  ( pronouced Sone-ya) is seen today for work in  eval of her HTN .  Has been eating more salt than she should.  Had a salty meal on Saturday night  At [facility] an extra HCTZ yesterday and today   Has had a headache  January 18, 2018:  Ashley Schneider is seen back for eval of her HTN. We added aldactone and reduced her Kdur at her last visit Blood pressure was still elevated several days later.  Dr. Excell Seltzer added amlodipine 5 mg a day. She is out on FMLA for the past several weeks due to her marked HTN Headache is gone.     Past Medical History:  Diagnosis Date  . Asthma with bronchitis 08/2013  . Childhood asthma   . Hypertension   . Menorrhagia   . Obesity   . Pregnancy induced hypertension 1995    Past Surgical History:  Procedure Laterality Date  . ENDOMETRIAL ABLATION  2009  . INCISION AND DRAINAGE ABSCESS Left 06/2013   left breast  . TUBAL LIGATION       Current Outpatient Medications  Medication Sig Dispense Refill  . amLODipine (NORVASC) 5 MG tablet Take 1 tablet (5 mg total) by mouth daily. 30 tablet 11  . BIOTIN PO Take 50,000 mcg by mouth  daily.     . medroxyPROGESTERone (PROVERA) 5 MG tablet One tablet a day for 5 days every other month if no spontaneous menses 15 tablet 1  . Multiple Vitamin (MULTIVITAMIN WITH MINERALS) TABS tablet Take 1 tablet by mouth 2 (two) times a week. Take on Monday and Thursday    . potassium chloride SA (K-DUR,KLOR-CON) 20 MEQ tablet Take 1 tablet (20 mEq total) by mouth daily. 90 tablet 3  . spironolactone (ALDACTONE) 25 MG tablet Take 1 tablet (25 mg total) by mouth daily. 90 tablet 3   No current facility-administered medications for this visit.     Allergies:   Augmentin [amoxicillin-pot clavulanate]; Other; and Penicillins    Social History:  The patient  reports that she has never smoked. She has never used smokeless tobacco. She reports that she does not drink alcohol or use drugs.   Family History:  The patient's family history includes Diabetes in her father; Diabetic kidney disease in her maternal uncle; Hyperlipidemia in her brother and brother; Hypertension in her brother, brother, father, and mother; Lung cancer in her maternal uncle; Prostate cancer in her maternal uncle.    ROS:  Please see the history of present illness.    Physical  Exam: Last menstrual period 12/30/2017.  GEN:  Well nourished, well developed in no acute distress HEENT: Normal NECK: No JVD; No carotid bruits LYMPHATICS: No lymphadenopathy CARDIAC: RR  RESPIRATORY:  Clear to auscultation without rales, wheezing or rhonchi  ABDOMEN: Soft, non-tender, non-distended MUSCULOSKELETAL:  No edema; No deformity  SKIN: Warm and dry NEUROLOGIC:  Alert and oriented x 3   EKG: January 01, 2018: Normal sinus rhythm at 76.  EKG is otherwise normal.    Recent Labs: 01/01/2018: Hemoglobin 14.3; Platelets 329; TSH 0.716 01/04/2018: ALT 11; BUN 15; Creatinine, Ser 0.94; Potassium 4.1; Sodium 141    Lipid Panel    Component Value Date/Time   CHOL 175 01/04/2018 1336   TRIG 112 01/04/2018 1336   HDL 51 01/04/2018 1336    CHOLHDL 3.4 01/04/2018 1336   CHOLHDL 3.7 01/04/2017 1004   VLDL 54 (H) 01/04/2017 1004   LDLCALC 102 (H) 01/04/2018 1336      Wt Readings from Last 3 Encounters:  01/04/18 222 lb (100.7 kg)  01/01/18 223 lb 12.8 oz (101.5 kg)  01/04/17 226 lb 6.4 oz (102.7 kg)      Other studies Reviewed: Additional studies/ records that were reviewed today include: . Review of the above records demonstrates:    ASSESSMENT AND PLAN:  1.   Essential hypertension:    Ashley Schneider presents with markedly elevated blood pressure.  She is been eating more salt than she should.  Her potassium levels have been persistently low.  I suspect that she has high renin level. She is on Aldactone 25 mg a day and amlodipine 5 mg a day.  Continue current medications.  Her diastolic blood pressure still mildly elevated.  We will consider increasing the Aldactone when I see her in 3 months.  We will continue to check her basic metabolic profile.   Current medicines are reviewed at length with the patient today.  The patient does not have concerns regarding medicines.  The following changes have been made:  no change  Labs/ tests ordered today include:  No orders of the defined types were placed in this encounter.     Signed, Kristeen MissPhilip Nahser, MD  01/18/2018 8:49 AM    [facility] [address], [place], [place]  [address] Phone: [phone]; Fax: [phone]

## 2018-02-13 ENCOUNTER — Telehealth: Payer: Self-pay | Admitting: *Deleted

## 2018-02-13 DIAGNOSIS — Z1211 Encounter for screening for malignant neoplasm of colon: Secondary | ICD-10-CM

## 2018-02-13 LAB — COLOGUARD: Cologuard: NEGATIVE

## 2018-02-13 NOTE — Telephone Encounter (Signed)
Call to patient. Negative cologuard results reviewed with patient and she verbalized understanding.   Patient agreeable to disposition. Will close encounter.     

## 2018-03-22 ENCOUNTER — Other Ambulatory Visit: Payer: Self-pay | Admitting: Obstetrics and Gynecology

## 2018-03-22 DIAGNOSIS — Z1231 Encounter for screening mammogram for malignant neoplasm of breast: Secondary | ICD-10-CM

## 2018-04-24 ENCOUNTER — Ambulatory Visit
Admission: RE | Admit: 2018-04-24 | Discharge: 2018-04-24 | Disposition: A | Discharge status: Home / Self Care | Payer: 59 | Source: Ambulatory Visit | Attending: Obstetrics and Gynecology | Admitting: Obstetrics and Gynecology

## 2018-04-24 DIAGNOSIS — Z1231 Encounter for screening mammogram for malignant neoplasm of breast: Secondary | ICD-10-CM

## 2018-05-03 ENCOUNTER — Encounter: Payer: Self-pay | Admitting: Cardiovascular Disease

## 2018-05-03 ENCOUNTER — Ambulatory Visit (INDEPENDENT_AMBULATORY_CARE_PROVIDER_SITE_OTHER): Payer: 59 | Admitting: Cardiovascular Disease

## 2018-05-03 VITALS — BP 132/84 | HR 71 | Ht 65.0 in | Wt 230.0 lb

## 2018-05-03 DIAGNOSIS — I10 Essential (primary) hypertension: Secondary | ICD-10-CM | POA: Diagnosis not present

## 2018-05-03 LAB — BASIC METABOLIC PANEL
BUN / CREAT RATIO: 11 (ref 9–23)
BUN: 12 mg/dL (ref 6–24)
CO2: 22 mmol/L (ref 20–29)
Calcium: 9.4 mg/dL (ref 8.7–10.2)
Chloride: 101 mmol/L (ref 96–106)
Creatinine, Ser: 1.11 mg/dL — ABNORMAL HIGH (ref 0.57–1.00)
GFR calc non Af Amer: 58 mL/min/{1.73_m2} — ABNORMAL LOW (ref >59)
GFR, EST AFRICAN AMERICAN: 67 mL/min/{1.73_m2} (ref >59)
GLUCOSE: 88 mg/dL (ref 65–99)
Potassium: 4 mmol/L (ref 3.5–5.2)
SODIUM: 139 mmol/L (ref 134–144)

## 2018-05-03 MED ORDER — SPIRONOLACTONE 25 MG PO TABS: 25.0000 mg | ORAL_TABLET | Freq: Every day | ORAL | 3 refills | Status: DC

## 2018-05-03 NOTE — Patient Instructions (Signed)
Medication Instructions:  Your physician recommends that you continue on your current medications as directed. Please refer to the Current Medication list given to you today.   Labwork: TODAY - basic metabolic panel   Testing/Procedures: None Ordered   Follow-Up: Your physician wants you to follow-up in: 6 months with Norma FredricksonLori Gerhardt, NP. You will receive a reminder letter in the mail two months in advance. If you don't receive a letter, please call our office to schedule the follow-up appointment.   If you need a refill on your cardiac medications before your next appointment, please call your pharmacy.   Thank you for choosing [facility]! Eligha BridegroomMichelle Swinyer, RN [phone]

## 2018-05-03 NOTE — Progress Notes (Signed)
Cardiology Office Note   Date:  05/03/2018   ID:  Ashley Schneider, DOB [birth year], MRN [id number]  PCP:  Patient, No Pcp Per  Cardiologist:   Kristeen MissPhilip Manpreet Strey, MD   No chief complaint on file.  Problem List: 1. HTN  Ashley MountSonja E Schneider is a 52 y.o. female who presents for HTN. No CP or dyspnea Working out since Oct.  Rare episodes of asthma   Dec. 13, 2017:  Ashley Schneider got married since Iast her. BP is well controlled at home Is elevated here    January 04, 2017:  Ashley Schneider is seen today for Follow-up of her hypertension. She brought her blood pressure log and her blood pressure readings at home are very well-controlled. Her blood pressures typically elevated when she comes to the office.  Avoiding salt.   Exercising some , going to the gym  No CP or dyspnea   January 01, 2018: [name]  ( pronouced Sone-ya) is seen today for work in  eval of her HTN .  Has been eating more salt than she should.  Had a salty meal on Saturday night  [facility] [employer] Took an extra HCTZ yesterday and today   Has had a headache  January 18, 2018:  Ashley Schneider is seen back for eval of her HTN. We added aldactone and reduced her Kdur at her last visit Blood pressure was still elevated several days later.  Dr. Excell Seltzerooper added amlodipine 5 mg a day. She is out on FMLA for the past several weeks due to her marked HTN Headache is gone.     May 03, 2018: Ashley Schneider is seen for further evaluaiton of her HTN   BP is well controlled.  Exercising regularly   Past Medical History:  Diagnosis Date  . Asthma with bronchitis 08/2013  . Childhood asthma   . Hypertension   . Menorrhagia   . Obesity   . Pregnancy induced hypertension 1995    Past Surgical History:  Procedure Laterality Date  . ENDOMETRIAL ABLATION  2009  . INCISION AND DRAINAGE ABSCESS Left 06/2013   left breast  . TUBAL LIGATION       Current Outpatient Medications  Medication Sig Dispense Refill  . amLODipine (NORVASC) 5 MG tablet Take 1  tablet (5 mg total) by mouth daily. 30 tablet 11  . BIOTIN PO Take 50,000 mcg by mouth daily.     . medroxyPROGESTERone (PROVERA) 5 MG tablet One tablet a day for 5 days every other month if no spontaneous menses 15 tablet 1  . Multiple Vitamin (MULTIVITAMIN WITH MINERALS) TABS tablet Take 1 tablet by mouth 2 (two) times a week. Take on Monday and Thursday    . potassium chloride SA (K-DUR,KLOR-CON) 20 MEQ tablet Take 1 tablet (20 mEq total) by mouth daily. 90 tablet 3  . spironolactone (ALDACTONE) 25 MG tablet Take 1 tablet (25 mg total) by mouth daily. 90 tablet 3   No current facility-administered medications for this visit.     Allergies:   Augmentin [amoxicillin-pot clavulanate]; Other; and Penicillins    Social History:  The patient  reports that she has never smoked. She has never used smokeless tobacco. She reports that she does not drink alcohol or use drugs.   Family History:  The patient's family history includes Diabetes in her father; Diabetic kidney disease in her maternal uncle; Hyperlipidemia in her brother and brother; Hypertension in her brother, brother, father, and mother; Lung cancer in her maternal uncle; Prostate cancer in her  maternal uncle.    ROS:  Please see the history of present illness.   Physical Exam: Blood pressure 132/84, pulse 71, height 5\' 5"  (1.651 m), weight 230 lb (104.3 kg).  GEN:   Middle age, mildly obese  HEENT: Normal NECK: No JVD; No carotid bruits LYMPHATICS: No lymphadenopathy CARDIAC: RR  RESPIRATORY:  Clear to auscultation without rales, wheezing or rhonchi  ABDOMEN: Soft, non-tender, non-distended MUSCULOSKELETAL:  No edema; No deformity  SKIN: Warm and dry NEUROLOGIC:  Alert and oriented x 3    EKG:  Recent Labs: 01/01/2018: Hemoglobin 14.3; Platelets 329; TSH 0.716 01/04/2018: ALT 11 01/18/2018: BUN 11; Creatinine, Ser 1.04; Potassium 4.3; Sodium 138    Lipid Panel    Component Value Date/Time   CHOL 175 01/04/2018 1336    TRIG 112 01/04/2018 1336   HDL 51 01/04/2018 1336   CHOLHDL 3.4 01/04/2018 1336   CHOLHDL 3.7 01/04/2017 1004   VLDL 54 (H) 01/04/2017 1004   LDLCALC 102 (H) 01/04/2018 1336      Wt Readings from Last 3 Encounters:  05/03/18 230 lb (104.3 kg)  01/18/18 222 lb (100.7 kg)  01/04/18 222 lb (100.7 kg)      Other studies Reviewed: Additional studies/ records that were reviewed today include: . Review of the above records demonstrates:    ASSESSMENT AND PLAN:  1.   Essential hypertension:    Her blood pressure is well controlled.  She ran out of her potassium a month or so ago.  She is on Aldactone. We will check a basic metabolic profile today.  I anticipate that she will need at least some additional supplementation.  We will have her return to see Norma Fredrickson, NP in 6 months.  I will see her in 1 year.  Current medicines are reviewed at length with the patient today.  The patient does not have concerns regarding medicines.  The following changes have been made:  no change  Labs/ tests ordered today include:   Orders Placed This Encounter  Procedures  . Basic Metabolic Panel (BMET)      Signed, Kristeen Miss, MD  05/03/2018 10:56 AM    [facility] [address] [place], [place], [place]  [address] Phone: [phone]; Fax: [phone]

## 2018-06-12 ENCOUNTER — Encounter: Payer: Self-pay | Admitting: Obstetrics and Gynecology

## 2019-01-03 ENCOUNTER — Telehealth: Payer: Self-pay

## 2019-01-03 NOTE — Telephone Encounter (Signed)

## 2019-01-09 ENCOUNTER — Ambulatory Visit: Payer: 59 | Admitting: Obstetrics and Gynecology

## 2019-01-09 ENCOUNTER — Ambulatory Visit: Payer: 59 | Admitting: Cardiovascular Disease

## 2019-01-29 ENCOUNTER — Telehealth: Payer: Self-pay

## 2019-01-29 NOTE — Telephone Encounter (Signed)
I called pt from the recall COVID pool to get her scheduled. She stated that she did not have any symptoms and was feeling fine and would like to wait until she could come into the office to be seen. She stated that Sept would be a good time. I put in a recall for Sept. To see Dr Elease Hashimoto. Pt does not need any refills at this time.

## 2019-01-30 ENCOUNTER — Ambulatory Visit: Payer: 59 | Admitting: Obstetrics and Gynecology

## 2019-03-22 ENCOUNTER — Other Ambulatory Visit: Payer: Self-pay | Admitting: Obstetrics and Gynecology

## 2019-03-22 DIAGNOSIS — Z1231 Encounter for screening mammogram for malignant neoplasm of breast: Secondary | ICD-10-CM

## 2019-04-15 ENCOUNTER — Ambulatory Visit: Payer: 59 | Admitting: Obstetrics and Gynecology

## 2019-04-24 ENCOUNTER — Ambulatory Visit (INDEPENDENT_AMBULATORY_CARE_PROVIDER_SITE_OTHER): Payer: 59 | Admitting: Obstetrics and Gynecology

## 2019-04-24 ENCOUNTER — Encounter: Payer: Self-pay | Admitting: Obstetrics and Gynecology

## 2019-04-24 ENCOUNTER — Other Ambulatory Visit: Payer: Self-pay

## 2019-04-24 VITALS — BP 138/86 | HR 64 | Temp 98.1°F | Ht 65.0 in | Wt 243.2 lb

## 2019-04-24 DIAGNOSIS — Z6841 Body Mass Index (BMI) 40.0 and over, adult: Secondary | ICD-10-CM

## 2019-04-24 DIAGNOSIS — N939 Abnormal uterine and vaginal bleeding, unspecified: Secondary | ICD-10-CM

## 2019-04-24 DIAGNOSIS — Z01419 Encounter for gynecological examination (general) (routine) without abnormal findings: Secondary | ICD-10-CM | POA: Diagnosis not present

## 2019-04-24 DIAGNOSIS — Z0000 Encounter for general adult medical examination without abnormal findings: Secondary | ICD-10-CM

## 2019-04-24 DIAGNOSIS — N951 Menopausal and female climacteric states: Secondary | ICD-10-CM | POA: Diagnosis not present

## 2019-04-24 NOTE — Progress Notes (Signed)
53 y.o. [id number] Married Black or [place] Not Hispanic or Latino female here for annual exam. H/O endometrial ablation in 2009. Last year she was given a script for cyclic provera for oligomenorrhea. She never took the provera secondary to concerns about side effects.  She has had 2 cycles in the last year. No regular hot flashes or night sweats. Sexually active, no pain. Today is her 3 year anniversary.   Period Duration (Days): 1 day of spotting Period Pattern: (!) Irregular Menstrual Flow: Light Dysmenorrhea: None  Patient's last menstrual period was 12/21/2018.          Sexually active: Yes.    The current method of family planning is tubal ligation.    Exercising: Yes.    walking Smoker:  no  Health Maintenance: Pap:  01-04-17 WNL NEG HR HPV 12-22-14 WNL NEG HR HPV History of abnormal Pap:  no MMG:  04/24/2018 Birads 1 negative Colonoscopy:  Cologuard 4/19 Negative BMD:   Never TDaP:  11/18/10  Gardasil: N/A   reports that she has never smoked. She has never used smokeless tobacco. She reports that she does not drink alcohol or use drugs. She has 2 grown kids. Doreene AdasStepson is 15. She works for an [employer], [employer].  Involved in [place]  Past Medical History:  Diagnosis Date  . Asthma with bronchitis 08/2013  . Childhood asthma   . Hypertension   . Menorrhagia   . Obesity   . Pregnancy induced hypertension 1995    Past Surgical History:  Procedure Laterality Date  . ENDOMETRIAL ABLATION  2009  . INCISION AND DRAINAGE ABSCESS Left 06/2013   left breast  . TUBAL LIGATION      Current Outpatient Medications  Medication Sig Dispense Refill  . amLODipine (NORVASC) 5 MG tablet Take 1 tablet (5 mg total) by mouth daily. 30 tablet 11  . BIOTIN PO Take 50,000 mcg by mouth daily.     . Multiple Vitamin (MULTIVITAMIN WITH MINERALS) TABS tablet Take 1 tablet by mouth 2 (two) times a week. Take on Monday and Thursday    . potassium chloride SA (K-DUR,KLOR-CON) 20 MEQ tablet Take 1  tablet (20 mEq total) by mouth daily. 90 tablet 3  . spironolactone (ALDACTONE) 25 MG tablet Take 1 tablet (25 mg total) by mouth daily. 90 tablet 3  . medroxyPROGESTERone (PROVERA) 5 MG tablet One tablet a day for 5 days every other month if no spontaneous menses (Patient not taking: Reported on 04/24/2019) 15 tablet 1   No current facility-administered medications for this visit.     Family History  Problem Relation Age of Onset  . Hypertension Mother   . Hypertension Father   . Diabetes Father   . Lung cancer Maternal Uncle   . Hypertension Brother   . Hyperlipidemia Brother   . Hypertension Brother   . Hyperlipidemia Brother   . Prostate cancer Maternal Uncle   . Diabetic kidney disease Maternal Uncle   . Breast cancer Neg Hx     Review of Systems  Constitutional: Negative.   HENT: Negative.   Eyes: Negative.   Respiratory: Negative.   Cardiovascular: Negative.   Gastrointestinal: Negative.   Endocrine: Negative.   Genitourinary: Negative.   Musculoskeletal: Negative.   Skin: Negative.   Allergic/Immunologic: Negative.   Neurological: Negative.   Hematological: Negative.   Psychiatric/Behavioral: Negative.     Exam:   BP 138/86 (BP Location: Right Arm, Patient Position: Sitting, Cuff Size: Large)   Pulse 64   Temp  98.1 F (36.7 C) (Skin)   Ht 5\' 5"  (1.651 m)   Wt 243 lb 3.2 oz (110.3 kg)   LMP 12/21/2018   BMI 40.47 kg/m   Weight change: @WEIGHTCHANGE @ Height:   Height: 5\' 5"  (165.1 cm)  Ht Readings from Last 3 Encounters:  04/24/19 5\' 5"  (1.651 m)  05/03/18 5\' 5"  (1.651 m)  01/04/18 5\' 5"  (1.651 m)    General appearance: alert, cooperative and appears stated age Head: Normocephalic, without obvious abnormality, atraumatic Neck: no adenopathy, supple, symmetrical, trachea midline and thyroid normal to inspection and palpation Lungs: clear to auscultation bilaterally Cardiovascular: regular rate and rhythm Breasts: normal appearance, no masses or  tenderness Abdomen: soft, non-tender; non distended,  no masses,  no organomegaly Extremities: extremities normal, atraumatic, no cyanosis or edema Skin: Skin color, texture, turgor normal. No rashes or lesions Lymph nodes: Cervical, supraclavicular, and axillary nodes normal. No abnormal inguinal nodes palpated Neurologic: Grossly normal   Pelvic: External genitalia:  no lesions              Urethra:  normal appearing urethra with no masses, tenderness or lesions              Bartholins and Skenes: normal                 Vagina: normal appearing vagina with normal color and discharge, no lesions              Cervix: no lesions               Bimanual Exam:  Uterus:  normal size, contour, position, consistency, mobility, non-tender              Adnexa: no mass, fullness, tenderness               Rectovaginal: Confirms               Anus:  normal sphincter tone, no lesions  Chaperone was present for exam.  A:  Well Woman with normal exam  HTN managed by Cardiology  BMI 40  Perimenopausal  AUB, only 2 episodes of spotting in the last year  P:   No pap this year  Mammogram scheduled  Cologuard negative last year  Discussed breast self exam  Discussed calcium and vit D intake  Screening labs  Declines use of provera  Return for gyn ultrasound

## 2019-04-24 NOTE — Patient Instructions (Signed)
EXERCISE AND DIET:  We recommended that you start or continue a regular exercise program for good health. Regular exercise means any activity that makes your heart beat faster and makes you sweat.  We recommend exercising at least 30 minutes per day at least 3 days a week, preferably 4 or 5.  We also recommend a diet low in fat and sugar.  Inactivity, poor dietary choices and obesity can cause diabetes, heart attack, stroke, and kidney damage, among others.    ALCOHOL AND SMOKING:  Women should limit their alcohol intake to no more than 7 drinks/beers/glasses of wine (combined, not each!) per week. Moderation of alcohol intake to this level decreases your risk of breast cancer and liver damage. And of course, no recreational drugs are part of a healthy lifestyle.  And absolutely no smoking or even second hand smoke. Most people know smoking can cause heart and lung diseases, but did you know it also contributes to weakening of your bones? Aging of your skin?  Yellowing of your teeth and nails?  CALCIUM AND VITAMIN D:  Adequate intake of calcium and Vitamin D are recommended.  The recommendations for exact amounts of these supplements seem to change often, but generally speaking 1,000 mg of calcium (between diet and supplement) and 800 units of Vitamin D per day seems prudent. Certain women may benefit from higher intake of Vitamin D.  If you are among these women, your doctor will have told you during your visit.    PAP SMEARS:  Pap smears, to check for cervical cancer or precancers,  have traditionally been done yearly, although recent scientific advances have shown that most women can have pap smears less often.  However, every woman still should have a physical exam from her gynecologist every year. It will include a breast check, inspection of the vulva and vagina to check for abnormal growths or skin changes, a visual exam of the cervix, and then an exam to evaluate the size and shape of the uterus and  ovaries.  And after 51 years of age, a rectal exam is indicated to check for rectal cancers. We will also provide age appropriate advice regarding health maintenance, like when you should have certain vaccines, screening for sexually transmitted diseases, bone density testing, colonoscopy, mammograms, etc.   MAMMOGRAMS:  All women over 40 years old should have a yearly mammogram. Many facilities now offer a "3D" mammogram, which may cost around $50 extra out of pocket. If possible,  we recommend you accept the option to have the 3D mammogram performed.  It both reduces the number of women who will be called back for extra views which then turn out to be normal, and it is better than the routine mammogram at detecting truly abnormal areas.    COLON CANCER SCREENING: Now recommend starting at age 45. At this time colonoscopy is not covered for routine screening until 50. There are take home tests that can be done between 45-49.   COLONOSCOPY:  Colonoscopy to screen for colon cancer is recommended for all women at age 50.  We know, you hate the idea of the prep.  We agree, BUT, having colon cancer and not knowing it is worse!!  Colon cancer so often starts as a polyp that can be seen and removed at colonscopy, which can quite literally save your life!  And if your first colonoscopy is normal and you have no family history of colon cancer, most women don't have to have it again for   10 years.  Once every ten years, you can do something that may end up saving your life, right?  We will be happy to help you get it scheduled when you are ready.  Be sure to check your insurance coverage so you understand how much it will cost.  It may be covered as a preventative service at no cost, but you should check your particular policy.      Breast Self-Awareness Breast self-awareness means being familiar with how your breasts look and feel. It involves checking your breasts regularly and reporting any changes to your  health care provider. Practicing breast self-awareness is important. A change in your breasts can be a sign of a serious medical problem. Being familiar with how your breasts look and feel allows you to find any problems early, when treatment is more likely to be successful. All women should practice breast self-awareness, including women who have had breast implants. How to do a breast self-exam One way to learn what is normal for your breasts and whether your breasts are changing is to do a breast self-exam. To do a breast self-exam: Look for Changes  1. Remove all the clothing above your waist. 2. Stand in front of a mirror in a room with good lighting. 3. Put your hands on your hips. 4. Push your hands firmly downward. 5. Compare your breasts in the mirror. Look for differences between them (asymmetry), such as: ? Differences in shape. ? Differences in size. ? Puckers, dips, and bumps in one breast and not the other. 6. Look at each breast for changes in your skin, such as: ? Redness. ? Scaly areas. 7. Look for changes in your nipples, such as: ? Discharge. ? Bleeding. ? Dimpling. ? Redness. ? A change in position. Feel for Changes Carefully feel your breasts for lumps and changes. It is best to do this while lying on your back on the floor and again while sitting or standing in the shower or tub with soapy water on your skin. Feel each breast in the following way:  Place the arm on the side of the breast you are examining above your head.  Feel your breast with the other hand.  Start in the nipple area and make  inch (2 cm) overlapping circles to feel your breast. Use the pads of your three middle fingers to do this. Apply light pressure, then medium pressure, then firm pressure. The light pressure will allow you to feel the tissue closest to the skin. The medium pressure will allow you to feel the tissue that is a little deeper. The firm pressure will allow you to feel the tissue  close to the ribs.  Continue the overlapping circles, moving downward over the breast until you feel your ribs below your breast.  Move one finger-width toward the center of the body. Continue to use the  inch (2 cm) overlapping circles to feel your breast as you move slowly up toward your collarbone.  Continue the up and down exam using all three pressures until you reach your armpit.  Write Down What You Find  Write down what is normal for each breast and any changes that you find. Keep a written record with breast changes or normal findings for each breast. By writing this information down, you do not need to depend only on memory for size, tenderness, or location. Write down where you are in your menstrual cycle, if you are still menstruating. If you are having trouble noticing differences   in your breasts, do not get discouraged. With time you will become more familiar with the variations in your breasts and more comfortable with the exam. How often should I examine my breasts? Examine your breasts every month. If you are breastfeeding, the best time to examine your breasts is after a feeding or after using a breast pump. If you menstruate, the best time to examine your breasts is 5-7 days after your period is over. During your period, your breasts are lumpier, and it may be more difficult to notice changes. When should I see my health care provider? See your health care provider if you notice:  A change in shape or size of your breasts or nipples.  A change in the skin of your breast or nipples, such as a reddened or scaly area.  Unusual discharge from your nipples.  A lump or thick area that was not there before.  Pain in your breasts.  Anything that concerns you.  

## 2019-04-25 ENCOUNTER — Telehealth: Payer: Self-pay | Admitting: Obstetrics and Gynecology

## 2019-04-25 DIAGNOSIS — N951 Menopausal and female climacteric states: Secondary | ICD-10-CM

## 2019-04-25 DIAGNOSIS — N939 Abnormal uterine and vaginal bleeding, unspecified: Secondary | ICD-10-CM

## 2019-04-25 LAB — CBC WITH DIFFERENTIAL/PLATELET
Basophils Absolute: 0 10*3/uL (ref 0.0–0.2)
Basos: 0 %
EOS (ABSOLUTE): 0.1 10*3/uL (ref 0.0–0.4)
Eos: 1 %
Hematocrit: 42.6 % (ref 34.0–46.6)
Hemoglobin: 14.4 g/dL (ref 11.1–15.9)
Immature Grans (Abs): 0 10*3/uL (ref 0.0–0.1)
Immature Granulocytes: 0 %
Lymphocytes Absolute: 2 10*3/uL (ref 0.7–3.1)
Lymphs: 30 %
MCH: 31.4 pg (ref 26.6–33.0)
MCHC: 33.8 g/dL (ref 31.5–35.7)
MCV: 93 fL (ref 79–97)
Monocytes Absolute: 0.4 10*3/uL (ref 0.1–0.9)
Monocytes: 6 %
Neutrophils Absolute: 4.4 10*3/uL (ref 1.4–7.0)
Neutrophils: 63 %
Platelets: 349 10*3/uL (ref 150–450)
RBC: 4.59 x10E6/uL (ref 3.77–5.28)
RDW: 13.1 % (ref 11.7–15.4)
WBC: 6.9 10*3/uL (ref 3.4–10.8)

## 2019-04-25 LAB — LIPID PANEL
Chol/HDL Ratio: 4 ratio (ref 0.0–4.4)
Cholesterol, Total: 177 mg/dL (ref 100–199)
HDL: 44 mg/dL (ref >39)
LDL Calculated: 110 mg/dL — ABNORMAL HIGH (ref 0–99)
Triglycerides: 117 mg/dL (ref 0–149)
VLDL Cholesterol Cal: 23 mg/dL (ref 5–40)

## 2019-04-25 LAB — TSH: TSH: 1.24 u[IU]/mL (ref 0.450–4.500)

## 2019-04-25 LAB — HEMOGLOBIN A1C
Est. average glucose Bld gHb Est-mCnc: 111 mg/dL
Hgb A1c MFr Bld: 5.5 % (ref 4.8–5.6)

## 2019-04-25 LAB — FOLLICLE STIMULATING HORMONE: FSH: 2.9 m[IU]/mL

## 2019-04-25 NOTE — Telephone Encounter (Signed)
Spoke with patient in regards to benefits for recommended ultrasound. Patient understood information presented. Patient would like to speak with someone to get more information as to why ultrasound is needed.   Forwarding to Triage Nurse

## 2019-04-25 NOTE — Telephone Encounter (Signed)
Returned call to patient. Patient is requesting to have ultrasound with [facility] in [facility], due to flexibility in scheduling, as she would need an appointment on a Monday or Friday. Patient also states facility is closer to her home.   Forwarding to Triage Nurse

## 2019-04-25 NOTE — Telephone Encounter (Signed)
Patient states she is returning a call to [place].

## 2019-04-26 NOTE — Telephone Encounter (Signed)
Message to Dr Talbert Nan to review.

## 2019-04-29 NOTE — Telephone Encounter (Signed)
Patient returned call. She has decided to go to [facility] for ultrasound.

## 2019-04-29 NOTE — Telephone Encounter (Signed)
Call to patient. Reviewed options for ultrasound here in office or at alternative free-stanading facility. Reviewed option as recommended by Dr Talbert Nan. Patient will consider [facility] and let me know her decision.

## 2019-04-29 NOTE — Telephone Encounter (Signed)
It is up to her. She just needs to understand that I won't have access to the images. We don't have CD drives to look at one if she tries to bring a copy. It would be better if she could do it at a [facility] facility so at least I could look at the images. Ultimately the decision is hers.

## 2019-05-01 NOTE — Telephone Encounter (Signed)
Patient called to say she has ultrasound at [facility] at 1030 on 05-06-19.

## 2019-05-01 NOTE — Telephone Encounter (Signed)
Call from patient. States she was unable to reach [facility]. Clarified phone number. Advised order has been entered.  Scheduled office visit for 05-06-19 to review ultrasound results and images. ( will reschedule if unable to have ultrasound on desired date.)   Routing to [place]. Patient will call back if additional needs.   Encounter closed.

## 2019-05-06 ENCOUNTER — Ambulatory Visit
Admission: RE | Admit: 2019-05-06 | Discharge: 2019-05-06 | Disposition: A | Discharge status: Home / Self Care | Payer: 59 | Source: Ambulatory Visit | Attending: Obstetrics and Gynecology | Admitting: Obstetrics and Gynecology

## 2019-05-06 ENCOUNTER — Encounter: Payer: Self-pay | Admitting: Obstetrics and Gynecology

## 2019-05-06 ENCOUNTER — Ambulatory Visit (INDEPENDENT_AMBULATORY_CARE_PROVIDER_SITE_OTHER): Payer: 59 | Admitting: Obstetrics and Gynecology

## 2019-05-06 ENCOUNTER — Other Ambulatory Visit: Payer: Self-pay

## 2019-05-06 VITALS — BP 138/88 | HR 80 | Temp 97.2°F | Wt 243.2 lb

## 2019-05-06 DIAGNOSIS — N939 Abnormal uterine and vaginal bleeding, unspecified: Secondary | ICD-10-CM | POA: Diagnosis not present

## 2019-05-06 DIAGNOSIS — Z9889 Other specified postprocedural states: Secondary | ICD-10-CM | POA: Diagnosis not present

## 2019-05-06 DIAGNOSIS — Z1231 Encounter for screening mammogram for malignant neoplasm of breast: Secondary | ICD-10-CM

## 2019-05-06 DIAGNOSIS — N951 Menopausal and female climacteric states: Secondary | ICD-10-CM

## 2019-05-06 NOTE — Progress Notes (Signed)
GYNECOLOGY  VISIT   HPI: 53 y.o.   Married Black or [place] Not Hispanic or Latino  female   [id number] with Patient's last menstrual period was 04/28/2019.   here to review her recent ultrasound. She has a h/o an endometrial ablation in 2009. She has had 3 cycles in the last year, 10/19, 3/20 and 7/20. All of her bleeding is light and only lasts a few days. Prior to the last several years she had monthly spotting.  She previously declined cyclic provera. Her FSH is in a premenopausal range, TSH is normal. Ultrasound from today showed several small myomas, an endometrial stripe of 3 mm and normal adnexa.   GYNECOLOGIC HISTORY: Patient's last menstrual period was 04/28/2019. Contraception: Tubal ligation Menopausal hormone therapy: none        OB History    Gravida  2   Para  2   Term  2   Preterm  0   AB  0   Living  2     SAB  0   TAB  0   Ectopic  0   Multiple  0   Live Births  2              Patient Active Problem List   Diagnosis Date Noted  . Extrinsic asthma, unspecified 12/19/2013  . Upper airway cough syndrome 11/28/2013  . Acute bronchitis 10/28/2013  . HTN (hypertension) 03/30/2012  . Well adult exam 03/10/2011    Past Medical History:  Diagnosis Date  . Asthma with bronchitis 08/2013  . Childhood asthma   . Hypertension   . Menorrhagia   . Obesity   . Pregnancy induced hypertension 1995    Past Surgical History:  Procedure Laterality Date  . ENDOMETRIAL ABLATION  2009  . INCISION AND DRAINAGE ABSCESS Left 06/2013   left breast  . TUBAL LIGATION      Current Outpatient Medications  Medication Sig Dispense Refill  . amLODipine (NORVASC) 5 MG tablet Take 1 tablet (5 mg total) by mouth daily. 30 tablet 11  . BIOTIN PO Take 50,000 mcg by mouth daily.     . Multiple Vitamin (MULTIVITAMIN WITH MINERALS) TABS tablet Take 1 tablet by mouth 2 (two) times a week. Take on Monday and Thursday    . potassium chloride SA (K-DUR,KLOR-CON) 20  MEQ tablet Take 1 tablet (20 mEq total) by mouth daily. 90 tablet 3  . spironolactone (ALDACTONE) 25 MG tablet Take 1 tablet (25 mg total) by mouth daily. 90 tablet 3  . medroxyPROGESTERone (PROVERA) 5 MG tablet One tablet a day for 5 days every other month if no spontaneous menses (Patient not taking: Reported on 05/06/2019) 15 tablet 1   No current facility-administered medications for this visit.      ALLERGIES: Augmentin [amoxicillin-pot clavulanate], Other, and Penicillins  Family History  Problem Relation Age of Onset  . Hypertension Mother   . Hypertension Father   . Diabetes Father   . Lung cancer Maternal Uncle   . Hypertension Brother   . Hyperlipidemia Brother   . Hypertension Brother   . Hyperlipidemia Brother   . Prostate cancer Maternal Uncle   . Diabetic kidney disease Maternal Uncle   . Breast cancer Neg Hx     Social History   Socioeconomic History  . Marital status: Married    Spouse name: Not on file  . Number of children: Not on file  . Years of education: Not on file  . Highest education level:  Not on file  Occupational History  . Occupation: Therapist, art Rep    Employer: [employer]  . Financial resource strain: Not on file  . Food insecurity    Worry: Not on file    Inability: Not on file  . Transportation needs    Medical: Not on file    Non-medical: Not on file  Tobacco Use  . Smoking status: Never Smoker  . Smokeless tobacco: Never Used  Substance and Sexual Activity  . Alcohol use: No  . Drug use: No  . Sexual activity: Yes    Partners: Male    Birth control/protection: None    Comment: ablation, BTL  Lifestyle  . Physical activity    Days per week: Not on file    Minutes per session: Not on file  . Stress: Not on file  Relationships  . Social Herbalist on phone: Not on file    Gets together: Not on file    Attends religious service: Not on file    Active member of club or organization: Not on  file    Attends meetings of clubs or organizations: Not on file    Relationship status: Not on file  . Intimate partner violence    Fear of current or ex partner: Not on file    Emotionally abused: Not on file    Physically abused: Not on file    Forced sexual activity: Not on file  Other Topics Concern  . Not on file  Social History Narrative  . Not on file    Review of Systems  Constitutional: Negative.   HENT: Negative.   Eyes: Negative.   Respiratory: Negative.   Cardiovascular: Negative.   Gastrointestinal: Negative.   Genitourinary: Negative.   Musculoskeletal: Negative.   Skin: Negative.   Neurological: Negative.   Endo/Heme/Allergies: Negative.   Psychiatric/Behavioral: Negative.     PHYSICAL EXAMINATION:    BP 138/88 (BP Location: Right Arm, Patient Position: Sitting, Cuff Size: Large)   Pulse 80   Temp (!) 97.2 F (36.2 C) (Skin)   Wt 243 lb 3.2 oz (110.3 kg)   LMP 04/28/2019   BMI 40.47 kg/m     General appearance: alert, cooperative and appears stated age  Images from [place] were reviewed with the patient  ASSESSMENT Irregular bleeding h/o endometrial ablation, low FSH Perimenopausal    PLAN Recommended endometrial biopsy, she declines. Discussed that the endometrial ablation can delay diagnosis of endometrial cancer, she has some risk factors for endometrial cancer, and a thin endometrium in a premenopausal woman can't be used instead of biopsy (like in a PMP woman). She is willing to try the cyclic provera (she has it at home) Will calendar bleeding and use of provera F/U in 6 months.    An After Visit Summary was printed and given to the patient.  ~15 minutes face to face time of which over 50% was spent in counseling.

## 2019-05-06 NOTE — Patient Instructions (Signed)

## 2019-05-07 ENCOUNTER — Encounter: Payer: Self-pay | Admitting: Obstetrics and Gynecology

## 2019-07-30 ENCOUNTER — Telehealth: Payer: Self-pay | Admitting: Cardiovascular Disease

## 2019-07-30 DIAGNOSIS — I10 Essential (primary) hypertension: Secondary | ICD-10-CM

## 2019-07-30 DIAGNOSIS — Z79899 Other long term (current) drug therapy: Secondary | ICD-10-CM

## 2019-07-30 MED ORDER — HYDROCHLOROTHIAZIDE 25 MG PO TABS: 25.0000 mg | ORAL_TABLET | Freq: Every day | ORAL | 3 refills | Status: AC

## 2019-07-30 NOTE — Telephone Encounter (Addendum)
Pt advised Dr. Elmarie Shiley changes and will have labs at her follow up with Truitt Merle NP 09/09/19.Marland Kitchen She will also keep track of her BP and call if she has any problems.

## 2019-07-30 NOTE — Telephone Encounter (Signed)
Pt called to report that since she started the Spironolactone she has been struggling with her breast size... she has had to spend a lot of money on many different bra's and it is becoming too costly. She has noticed they have been enlarged and when looking up potential causes she found that it is possibly the Spironolactone.   I asked the pt about the Provera on her med list but she says this was a problem prior to starting that 04/2019... this has been happening since changing fluid pills over a year ago.   Pt is asking if she can stop it and go back on her HCTZ 25mg .... she reports that she does not have any BP readings but the times she has checked it, it has been "good"..   Pt is returning to see Truitt Merle NP 09/09/19.   Will forward to Dr. Acie Fredrickson for review and recommendations.

## 2019-07-30 NOTE — Telephone Encounter (Signed)
Spironolactone can cause breast enlargement. OK to change back to HCTZ 25 mg a day and Kdur 20 meq a day .   Check BMP in 2-3 weeks after changing

## 2019-07-30 NOTE — Telephone Encounter (Signed)
New message:    Patient calling and would like a refill how every she would like to speak with some one concering her medications before refills. Please call patient back.

## 2019-08-14 ENCOUNTER — Telehealth: Payer: Self-pay | Admitting: Cardiovascular Disease

## 2019-08-14 MED ORDER — POTASSIUM CHLORIDE CRYS ER 20 MEQ PO TBCR: 20.0000 meq | EXTENDED_RELEASE_TABLET | Freq: Every day | ORAL | 0 refills | Status: DC

## 2019-08-14 NOTE — Telephone Encounter (Signed)
°*  STAT* If patient is at the pharmacy, call can be transferred to refill team.   1. Which medications need to be refilled? (please list name of each medication and dose if known) potasium  20  mg  2. Which pharmacy/location (including street and city if local pharmacy) is medication to be sent to? [facility]  [address]   3. Do they need a 30 day or 90 day supply? [place]

## 2019-08-14 NOTE — Telephone Encounter (Signed)
Pt's medication was sent to pt's pharmacy as requested. Confirmation received.  °

## 2019-09-06 NOTE — Progress Notes (Signed)
CARDIOLOGY OFFICE NOTE  Date:  09/09/2019    Ashley Schneider Date of Birth: [birth year] Medical Record #[id number]  PCP:  Patient, No Pcp Per  Cardiologist:  Nahser   Chief Complaint  Patient presents with  . Follow-up    Seen for Dr. Elease Hashimoto    History of Present Illness: Ashley Schneider is a 53 y.o. female who presents today for a 16 month check. Seen for Dr. Elease Hashimoto.   She has a history of HTN, asthma and obesity.   Last seen in July of 2019 by Dr. Elease Hashimoto.   The patient does not have symptoms concerning for COVID-19 infection (fever, chills, cough, or new shortness of breath).   Comes in today. Here alone. She says she is doing ok. No chest pain. Breathing is ok. She tries to avoid salt. No headache. She says her BP is good at home - in the 120 to 130's over 70 to 80's at home. She is no longer on Aldactone due to breast enlargement. She does not believe she is on Norvasc - does not feel like she has been taking that for some time. She says she is walking 3 to 5 miles most days.   Past Medical History:  Diagnosis Date  . Asthma with bronchitis 08/2013  . Childhood asthma   . Hypertension   . Menorrhagia   . Obesity   . Pregnancy induced hypertension 1995    Past Surgical History:  Procedure Laterality Date  . ENDOMETRIAL ABLATION  2009  . INCISION AND DRAINAGE ABSCESS Left 06/2013   left breast  . TUBAL LIGATION       Medications: Current Meds  Medication Sig  . BIOTIN PO Take 50,000 mcg by mouth daily.   . hydrochlorothiazide (HYDRODIURIL) 25 MG tablet Take 1 tablet (25 mg total) by mouth daily.  . medroxyPROGESTERone (PROVERA) 5 MG tablet One tablet a day for 5 days every other month if no spontaneous menses  . Multiple Vitamin (MULTIVITAMIN WITH MINERALS) TABS tablet Take 1 tablet by mouth 2 (two) times a week. Take on Monday and Thursday  . potassium chloride SA (KLOR-CON) 20 MEQ tablet Take 1 tablet (20 mEq total) by mouth daily.  . [DISCONTINUED]  amLODipine (NORVASC) 5 MG tablet Take 1 tablet (5 mg total) by mouth daily.  . [DISCONTINUED] potassium chloride SA (KLOR-CON) 20 MEQ tablet Take 1 tablet (20 mEq total) by mouth daily. Please keep upcoming appt in November before anymore refills. Thank you     Allergies: Allergies  Allergen Reactions  . Augmentin [Amoxicillin-Pot Clavulanate] Hives  . Other     ALLERGIC TO "ALL CILLINS"-CAUSES HIVES  . Penicillins     Rash, SOB    Social History: The patient  reports that she has never smoked. She has never used smokeless tobacco. She reports that she does not drink alcohol or use drugs.   Family History: The patient's family history includes Diabetes in her father; Diabetic kidney disease in her maternal uncle; Hyperlipidemia in her brother and brother; Hypertension in her brother, brother, father, and mother; Lung cancer in her maternal uncle; Prostate cancer in her maternal uncle.   Review of Systems: Please see the history of present illness.   All other systems are reviewed and negative.   Physical Exam: VS:  BP (!) 142/100 (BP Location: Left Arm, Patient Position: Sitting, Cuff Size: Large)   Pulse 71   Ht 5\' 6"  (1.676 m)   Wt 239 lb 1.9 oz (108.5  kg)   BMI 38.59 kg/m  .  BMI Body mass index is 38.59 kg/m.  Wt Readings from Last 3 Encounters:  09/09/19 239 lb 1.9 oz (108.5 kg)  05/06/19 243 lb 3.2 oz (110.3 kg)  04/24/19 243 lb 3.2 oz (110.3 kg)   BP recheck by me is 150/110.   General: Pleasant. Alert and in no acute distress. She is obese. Weight is up from last visit here in 2019.   HEENT: Normal.  Neck: Supple, no JVD, carotid bruits, or masses noted.  Cardiac: Regular rate and rhythm. No murmurs, rubs, or gallops. No edema.  Respiratory:  Lungs are clear to auscultation bilaterally with normal work of breathing.  GI: Soft and nontender.  MS: No deformity or atrophy. Gait and ROM intact.  Skin: Warm and dry. Color is normal.  Neuro:  Strength and sensation  are intact and no gross focal deficits noted.  Psych: Alert, appropriate and with normal affect.   LABORATORY DATA:  EKG:  EKG is ordered today. This demonstrates NSR.  Lab Results  Component Value Date   WBC 6.9 04/24/2019   HGB 14.4 04/24/2019   HCT 42.6 04/24/2019   PLT 349 04/24/2019   GLUCOSE 88 05/03/2018   CHOL 177 04/24/2019   TRIG 117 04/24/2019   HDL 44 04/24/2019   LDLCALC 110 (H) 04/24/2019   ALT 11 01/04/2018   AST 20 01/04/2018   NA 139 05/03/2018   K 4.0 05/03/2018   CL 101 05/03/2018   CREATININE 1.11 (H) 05/03/2018   BUN 12 05/03/2018   CO2 22 05/03/2018   TSH 1.240 04/24/2019   HGBA1C 5.5 04/24/2019     BNP (last 3 results) No results for input(s): BNP in the last 8760 hours.  ProBNP (last 3 results) No results for input(s): PROBNP in the last 8760 hours.   Other Studies Reviewed Today:    ASSESSMENT AND PLAN:  1. HTN - she says she has good control at home - I have asked her to monitor - she does not feel she is on Norvasc - not clear as to why - will see a BP diary in the next week or so and restart as needed. She needs a BMET today. She is intolerant to Aldactone - updated her allergy list.   2. Obesity - she reports walking 3 to 5 miles a day.   3. Asthma - not discussed.  4. COVID-19 Education: The signs and symptoms of COVID-19 were discussed with the patient and how to seek care for testing (follow up with PCP or arrange E-visit).  The importance of social distancing, staying at home, hand hygiene and wearing a mask when out in public were discussed today.  Current medicines are reviewed with the patient today.  The patient does not have concerns regarding medicines other than what has been noted above.  The following changes have been made:  See above.  Labs/ tests ordered today include:    Orders Placed This Encounter  Procedures  . Basic metabolic panel     Disposition:   FU with Dr. Acie Fredrickson in one year.    Patient is  agreeable to this plan and will call if any problems develop in the interim.   SignedTruitt Merle, NP  09/09/2019 9:57 AM  [facility] [address] [place], [place]  [address] Phone: [phone] Fax: [phone]

## 2019-09-09 ENCOUNTER — Encounter: Payer: Self-pay | Admitting: Nurse Practitioner

## 2019-09-09 ENCOUNTER — Other Ambulatory Visit (INDEPENDENT_AMBULATORY_CARE_PROVIDER_SITE_OTHER): Payer: 59 | Admitting: *Deleted

## 2019-09-09 ENCOUNTER — Other Ambulatory Visit: Payer: Self-pay

## 2019-09-09 ENCOUNTER — Ambulatory Visit (INDEPENDENT_AMBULATORY_CARE_PROVIDER_SITE_OTHER): Payer: 59 | Admitting: Nurse Practitioner

## 2019-09-09 VITALS — BP 142/100 | HR 71 | Ht 66.0 in | Wt 239.1 lb

## 2019-09-09 DIAGNOSIS — Z7189 Other specified counseling: Secondary | ICD-10-CM | POA: Diagnosis not present

## 2019-09-09 DIAGNOSIS — I10 Essential (primary) hypertension: Secondary | ICD-10-CM

## 2019-09-09 DIAGNOSIS — Z79899 Other long term (current) drug therapy: Secondary | ICD-10-CM | POA: Diagnosis not present

## 2019-09-09 LAB — BASIC METABOLIC PANEL
BUN/Creatinine Ratio: 10 (ref 9–23)
BUN: 10 mg/dL (ref 6–24)
CO2: 27 mmol/L (ref 20–29)
Calcium: 9.4 mg/dL (ref 8.7–10.2)
Chloride: 99 mmol/L (ref 96–106)
Creatinine, Ser: 1.04 mg/dL — ABNORMAL HIGH (ref 0.57–1.00)
GFR calc Af Amer: 72 mL/min/{1.73_m2} (ref >59)
GFR calc non Af Amer: 62 mL/min/{1.73_m2} (ref >59)
Glucose: 97 mg/dL (ref 65–99)
Potassium: 3.7 mmol/L (ref 3.5–5.2)
Sodium: 138 mmol/L (ref 134–144)

## 2019-09-09 MED ORDER — POTASSIUM CHLORIDE CRYS ER 20 MEQ PO TBCR: 20.0000 meq | EXTENDED_RELEASE_TABLET | Freq: Every day | ORAL | 3 refills | Status: AC

## 2019-09-09 NOTE — Patient Instructions (Addendum)
After Visit Summary:  We will be checking the following labs today - BMET   Medication Instructions:    Continue with your current medicines.    If you need a refill on your cardiac medications before your next appointment, please call your pharmacy.     Testing/Procedures To Be Arranged:  N/A  Follow-Up:   See Dr. Acie Fredrickson in a year    At [facility], you and your health needs are our priority.  As part of our continuing mission to provide you with exceptional heart care, we have created designated Provider Care Teams.  These Care Teams include your primary Cardiologist (physician) and Advanced Practice Providers (APPs -  Physician Assistants and Nurse Practitioners) who all work together to provide you with the care you need, when you need it.  Special Instructions:  . Stay safe, stay home, wash your hands for at least 20 seconds and wear a mask when out in public.   It was good to talk with you today.   BP diary for the next week or so - then send to [place] so we can review and see if Norvasc needs to be added back.    Call the [place] office at [phone] if you have any questions, problems or concerns.

## 2019-09-09 NOTE — Progress Notes (Signed)
error 

## 2019-09-10 ENCOUNTER — Telehealth: Payer: Self-pay | Admitting: Cardiovascular Disease

## 2019-09-10 NOTE — Telephone Encounter (Signed)
Patient returning call in regards to lab results.  

## 2019-09-10 NOTE — Telephone Encounter (Signed)
Spoke with pt and reviewed recent lab results.  Pt unable to get BP last night.  Checked it this morning at home and it was 159/103.  Pt felt this was inaccurate and went to local [facility] to have them check it.  Pharmacist checked BP and it was 130/76.  Pt has ordered a new monitor for home that should arrive in the next day or two.  Advised pt to continue current medications and call if any BP issues once she receives her monitor.  Pt verbalized understanding and was in agreement with this plan.

## 2019-11-04 ENCOUNTER — Encounter: Payer: Self-pay | Admitting: Obstetrics and Gynecology

## 2019-11-05 ENCOUNTER — Telehealth: Payer: Self-pay | Admitting: Obstetrics and Gynecology

## 2019-11-05 NOTE — Telephone Encounter (Signed)
Patient sent the following message through MyChart. Routing to triage to assist patient with request.  [facility]  Phone Number: [phone]  Hello Dr. Oscar La,  I have taken all of the medroxyPROGESTERone 5 MG tablets as directed.  I have not had a period before or after taking this medication.   I'm not sure what you want to do next... Please contact me with the next steps.   Thank you for your time,  Ashley Schneider

## 2019-11-05 NOTE — Telephone Encounter (Signed)
Spoke to pt. Pt states started taking Provera in 04/2019  for last 6 months and now wanting to know what the next steps are. Pt states no cycle in 6 months since taking provera. After reviewing with Dr Oscar La, I will return a call to pt with recommendations and plan. Pt agreeable.   Will route to Dr Oscar La for advice and next steps in plan of care.

## 2019-11-06 NOTE — Telephone Encounter (Signed)
Spoke to pt. Pt given information re: no cycle for last 6 months. Clarified for pt after speaking to Dr Oscar La. Pt now in menopause. Pt to call back to our office if having any spotting or bleeding until AEX in 04/2020 and will have follow up labs at this time. Pt agreeable and verbalized understanding.   Will route to Dr Oscar La for review and will close encounter.

## 2019-11-06 NOTE — Telephone Encounter (Signed)
If she has taken the provera over the last 6 months and not had any bleeding she should be able to stop taking the provera. She should let us know if she has any further bleeding.

## 2019-12-27 ENCOUNTER — Telehealth: Payer: Self-pay | Admitting: *Deleted

## 2019-12-27 NOTE — Telephone Encounter (Signed)
Routing request to Dr. Oscar La to review.

## 2019-12-27 NOTE — Telephone Encounter (Signed)
Patient would like Dr Oscar La to "review medical history to approve Covid vaccine." Does not have PCP. Advised Dr Oscar La is out of office today.

## 2019-12-29 NOTE — Telephone Encounter (Signed)
Please let the patient know that as much as I would love for her to get the vaccine, I think she falls into group 4 and [place] is currently vaccinating groups 1, 2 & 3. She can check with her Cardiologist and see what they say.

## 2019-12-30 NOTE — Telephone Encounter (Signed)
Spoke with patient, advised as seen below per Dr. Jertson. Patient verbalizes understanding and is agreeable. Encounter closed.  

## 2020-01-30 ENCOUNTER — Telehealth: Payer: Self-pay

## 2020-01-30 ENCOUNTER — Encounter: Payer: Self-pay | Admitting: Obstetrics and Gynecology

## 2020-01-30 NOTE — Telephone Encounter (Signed)
Pt. Called stating she wanted to know if you were taking new patients I told her you were not at this time. She said you came highly recommended by her friend and your pt. Alycia Rossetti, I told her I could send you a message but you were not taking pts. At this time.

## 2020-01-31 ENCOUNTER — Telehealth: Payer: Self-pay | Admitting: *Deleted

## 2020-01-31 ENCOUNTER — Other Ambulatory Visit: Payer: Self-pay | Admitting: *Deleted

## 2020-01-31 NOTE — Telephone Encounter (Signed)
See phone encounter.

## 2020-01-31 NOTE — Telephone Encounter (Signed)
Call to patient. Reports Covid vaccine dates 01-08-20 and 01-29-20 and received Pfizer vaccine.  Immunization record updated.   Routing to provider. Encounter closed.

## 2020-01-31 NOTE — Telephone Encounter (Signed)
My Chart message from patient:  Buffey, Zabinski, MD 17 hours ago (10:51 PM)   I have received both doses of the Pfizer vaccine.

## 2020-04-08 ENCOUNTER — Telehealth: Payer: Self-pay

## 2020-04-08 NOTE — Telephone Encounter (Signed)
Patient is calling in regards to questions about blood work.

## 2020-04-09 NOTE — Telephone Encounter (Signed)
Spoke with patient.   1. Patient is establishing care with new PCP, appt on 04/23/20, requesting records.   2. Patient is scheduled for AEX with Dr. Oscar La on 04/29/20. Last FSH 2.9 on 04/24/19. Reports no menses since 02/2019. Denies any other GYN symptoms. Patient asking if PCP needs to check [facility]? Advised we are unable to order [facility] labs with PCP, would need to further discuss with PCP or wait until AEX with Dr. Oscar La. I can review with Dr. Oscar La and f/u with any additional recommendations. Patient thankful and verbalizes understanding.   Front office will assist with records, call transferred to [place].   Dr. Oscar La -please review and advise on [facility] recommendations.

## 2020-04-10 NOTE — Telephone Encounter (Signed)
If her primary would draw an [facility] when they do her routine blood work it may be helpful.

## 2020-04-10 NOTE — Telephone Encounter (Signed)
Spoke with pt. Pt given recommendations per Dr Oscar La. Pt agreeable to have FSH drawn by PCP. Pt has lab appt with PCP on 04/24/20.   Routing to Dr Oscar La for review.  Encounter closed.

## 2020-04-24 ENCOUNTER — Other Ambulatory Visit: Payer: Self-pay | Admitting: Obstetrics and Gynecology

## 2020-04-24 DIAGNOSIS — Z1231 Encounter for screening mammogram for malignant neoplasm of breast: Secondary | ICD-10-CM

## 2020-04-27 NOTE — Progress Notes (Signed)
54 y.o. [id number] Married Black or [place] American Not Hispanic or Latino female here for annual exam.  She says that since taking provera she can not eat or smell of meat or the taste it. She says that it is nausea.  H/O endometrial ablation in 2009. She was seen last year with irregular bleeding, ultrasound with thin endometrial stripe, declined endometrial biopsy. FSH was 2.9. She was given a script for cyclic provera   Ever since she took the provera last year she has had an aversion to the smell of meat. She attributes this change to the 5 day course of provera. No vaginal bleeding. No dyspareunia. She is hot at night, no vasomotor symptoms.  No bowel or bladder changes.   Patient's last menstrual period was 02/27/2019.          Sexually active: Yes.    The current method of family planning is post menopausal status.    Exercising: Yes.    Walking 2-3 miles a week  Smoker:  no  Health Maintenance: Pap: 01-04-17 WNL NEG HR HPV 12-22-14 WNL NEG HR HPV History of abnormal Pap:no MMG:  05/07/19 density C Bi-rads 1 neg, Scheduled next month BMD:   Never  Colonoscopy: cologuard 4/19 neg  TDaP:  11/18/10  Gardasil: N/A   reports that she has never smoked. She has never used smokeless tobacco. She reports that she does not drink alcohol and does not use drugs. She has 2 grown kids, no grandchildren. Doreene Adas is 16. She works for an [employer], site.  Involved in [place]  Past Medical History:  Diagnosis Date  . Asthma with bronchitis 08/2013  . Childhood asthma   . Hypertension   . Menorrhagia   . Obesity   . Pregnancy induced hypertension 1995    Past Surgical History:  Procedure Laterality Date  . ENDOMETRIAL ABLATION  2009  . INCISION AND DRAINAGE ABSCESS Left 06/2013   left breast  . TUBAL LIGATION      Current Outpatient Medications  Medication Sig Dispense Refill  . BIOTIN PO Take 50,000 mcg by mouth daily.     . hydrochlorothiazide (HYDRODIURIL) 25 MG tablet Take 1 tablet  (25 mg total) by mouth daily. 90 tablet 3  . Multiple Vitamin (MULTIVITAMIN WITH MINERALS) TABS tablet Take 1 tablet by mouth 2 (two) times a week. Take on Monday and Thursday    . potassium chloride SA (KLOR-CON) 20 MEQ tablet Take 1 tablet (20 mEq total) by mouth daily. 90 tablet 3   No current facility-administered medications for this visit.    Family History  Problem Relation Age of Onset  . Hypertension Mother   . Hypertension Father   . Diabetes Father   . Lung cancer Maternal Uncle   . Hypertension Brother   . Hyperlipidemia Brother   . Hypertension Brother   . Hyperlipidemia Brother   . Prostate cancer Maternal Uncle   . Diabetic kidney disease Maternal Uncle   . Breast cancer Neg Hx     Review of Systems  Constitutional: Positive for appetite change.    Exam:   BP 130/82   Ht 5\' 5"  (1.651 m)   Wt 225 lb (102.1 kg)   LMP 02/27/2019   BMI 37.44 kg/m   Weight change: @WEIGHTCHANGE @ Height:   Height: 5\' 5"  (165.1 cm)  Ht Readings from Last 3 Encounters:  04/29/20 5\' 5"  (1.651 m)  09/09/19 5\' 6"  (1.676 m)  04/24/19 5\' 5"  (1.651 m)    General appearance: alert, cooperative  and appears stated age Head: Normocephalic, without obvious abnormality, atraumatic Neck: no adenopathy, supple, symmetrical, trachea midline and thyroid normal to inspection and palpation Lungs: clear to auscultation bilaterally Cardiovascular: regular rate and rhythm Breasts: normal appearance, no masses or tenderness, bilaterally with fibrocysitic changes. In the left breast at 12 o'clock a few cm from the areolar region is a 2-3 cm area of increased nodularity Abdomen: soft, non-tender; non distended,  no masses,  no organomegaly Extremities: extremities normal, atraumatic, no cyanosis or edema Skin: Skin color, texture, turgor normal. No rashes or lesions Lymph nodes: Cervical, supraclavicular, and axillary nodes normal. No abnormal inguinal nodes palpated Neurologic: Grossly  normal   Pelvic: External genitalia:  no lesions              Urethra:  normal appearing urethra with no masses, tenderness or lesions              Bartholins and Skenes: normal                 Vagina: normal appearing vagina with normal color and discharge, no lesions. No signs of atrophy              Cervix: no lesions               Bimanual Exam:  Uterus:  no masses or tenderness              Adnexa: no mass, fullness, tenderness               Rectovaginal: Confirms               Anus:  normal sphincter tone, no lesions  Carolynn Serve chaperoned for the exam.  Labs from primary scanned. Recent FSH 15.5 MIU/ML  A:  Well Woman with normal exam  Perimenopausal based on lab work, no cycle in over a year. H/O endometrial ablation. She is aware to call with bleeding.   Left breast nodularity/lump at 12 o'clock  Aversion to the smell of meat since last year. I told her this should not be due to the provera she took last year. Recommended she discuss with her primary.   P:   No pap this year  Diagnostic breast imaging  Cologuard next year  Labs with primary  Discussed breast self exam  Discussed calcium and vit D intake

## 2020-04-29 ENCOUNTER — Ambulatory Visit (INDEPENDENT_AMBULATORY_CARE_PROVIDER_SITE_OTHER): Payer: No Typology Code available for payment source | Admitting: Obstetrics and Gynecology

## 2020-04-29 ENCOUNTER — Encounter: Payer: Self-pay | Admitting: Obstetrics and Gynecology

## 2020-04-29 ENCOUNTER — Other Ambulatory Visit: Payer: Self-pay

## 2020-04-29 ENCOUNTER — Telehealth: Payer: Self-pay | Admitting: *Deleted

## 2020-04-29 VITALS — BP 130/82 | Ht 65.0 in | Wt 225.0 lb

## 2020-04-29 DIAGNOSIS — Z01419 Encounter for gynecological examination (general) (routine) without abnormal findings: Secondary | ICD-10-CM

## 2020-04-29 DIAGNOSIS — N6325 Unspecified lump in the left breast, overlapping quadrants: Secondary | ICD-10-CM | POA: Diagnosis not present

## 2020-04-29 NOTE — Telephone Encounter (Signed)
-----   Message from Romualdo Bolk, MD sent at 04/29/2020  9:56 AM EDT ----- Please schedule her for diagnostic imaging, attention left breast at 12 o'clock.

## 2020-04-29 NOTE — Telephone Encounter (Signed)
Spoke with Ashley Schneider at [facility]. Last screening MMG 05/06/19. Patient scheduled for bilateral Dx MMG and left breast US, if needed, on 05/07/20 at 10:50am, arrive at 10:30am.   Call to patient, advised of appt date and time. Patient verbalizes understanding and is agreeable.  Placed in MMG hold.    Routing to provider for final review. Patient is agreeable to disposition. Will close encounter.

## 2020-04-29 NOTE — Patient Instructions (Signed)
EXERCISE AND DIET:  We recommended that you start or continue a regular exercise program for good health. Regular exercise means any activity that makes your heart beat faster and makes you sweat.  We recommend exercising at least 30 minutes per day at least 3 days a week, preferably 4 or 5.  We also recommend a diet low in fat and sugar.  Inactivity, poor dietary choices and obesity can cause diabetes, heart attack, stroke, and kidney damage, among others.    ALCOHOL AND SMOKING:  Women should limit their alcohol intake to no more than 7 drinks/beers/glasses of wine (combined, not each!) per week. Moderation of alcohol intake to this level decreases your risk of breast cancer and liver damage. And of course, no recreational drugs are part of a healthy lifestyle.  And absolutely no smoking or even second hand smoke. Most people know smoking can cause heart and lung diseases, but did you know it also contributes to weakening of your bones? Aging of your skin?  Yellowing of your teeth and nails?  CALCIUM AND VITAMIN D:  Adequate intake of calcium and Vitamin D are recommended.  The recommendations for exact amounts of these supplements seem to change often, but generally speaking 1,000 mg of calcium (between diet and supplement) and 800 units of Vitamin D per day seems prudent. Certain women may benefit from higher intake of Vitamin D.  If you are among these women, your doctor will have told you during your visit.    PAP SMEARS:  Pap smears, to check for cervical cancer or precancers,  have traditionally been done yearly, although recent scientific advances have shown that most women can have pap smears less often.  However, every woman still should have a physical exam from her gynecologist every year. It will include a breast check, inspection of the vulva and vagina to check for abnormal growths or skin changes, a visual exam of the cervix, and then an exam to evaluate the size and shape of the uterus and  ovaries.  And after 52 years of age, a rectal exam is indicated to check for rectal cancers. We will also provide age appropriate advice regarding health maintenance, like when you should have certain vaccines, screening for sexually transmitted diseases, bone density testing, colonoscopy, mammograms, etc.   MAMMOGRAMS:  All women over 40 years old should have a yearly mammogram. Many facilities now offer a "3D" mammogram, which may cost around $50 extra out of pocket. If possible,  we recommend you accept the option to have the 3D mammogram performed.  It both reduces the number of women who will be called back for extra views which then turn out to be normal, and it is better than the routine mammogram at detecting truly abnormal areas.    COLON CANCER SCREENING: Now recommend starting at age 45. At this time colonoscopy is not covered for routine screening until 50. There are take home tests that can be done between 45-49.   COLONOSCOPY:  Colonoscopy to screen for colon cancer is recommended for all women at age 50.  We know, you hate the idea of the prep.  We agree, BUT, having colon cancer and not knowing it is worse!!  Colon cancer so often starts as a polyp that can be seen and removed at colonscopy, which can quite literally save your life!  And if your first colonoscopy is normal and you have no family history of colon cancer, most women don't have to have it again for   10 years.  Once every ten years, you can do something that may end up saving your life, right?  We will be happy to help you get it scheduled when you are ready.  Be sure to check your insurance coverage so you understand how much it will cost.  It may be covered as a preventative service at no cost, but you should check your particular policy.      Breast Self-Awareness Breast self-awareness means being familiar with how your breasts look and feel. It involves checking your breasts regularly and reporting any changes to your  health care provider. Practicing breast self-awareness is important. A change in your breasts can be a sign of a serious medical problem. Being familiar with how your breasts look and feel allows you to find any problems early, when treatment is more likely to be successful. All women should practice breast self-awareness, including women who have had breast implants. How to do a breast self-exam One way to learn what is normal for your breasts and whether your breasts are changing is to do a breast self-exam. To do a breast self-exam: Look for Changes  1. Remove all the clothing above your waist. 2. Stand in front of a mirror in a room with good lighting. 3. Put your hands on your hips. 4. Push your hands firmly downward. 5. Compare your breasts in the mirror. Look for differences between them (asymmetry), such as: ? Differences in shape. ? Differences in size. ? Puckers, dips, and bumps in one breast and not the other. 6. Look at each breast for changes in your skin, such as: ? Redness. ? Scaly areas. 7. Look for changes in your nipples, such as: ? Discharge. ? Bleeding. ? Dimpling. ? Redness. ? A change in position. Feel for Changes Carefully feel your breasts for lumps and changes. It is best to do this while lying on your back on the floor and again while sitting or standing in the shower or tub with soapy water on your skin. Feel each breast in the following way:  Place the arm on the side of the breast you are examining above your head.  Feel your breast with the other hand.  Start in the nipple area and make  inch (2 cm) overlapping circles to feel your breast. Use the pads of your three middle fingers to do this. Apply light pressure, then medium pressure, then firm pressure. The light pressure will allow you to feel the tissue closest to the skin. The medium pressure will allow you to feel the tissue that is a little deeper. The firm pressure will allow you to feel the tissue  close to the ribs.  Continue the overlapping circles, moving downward over the breast until you feel your ribs below your breast.  Move one finger-width toward the center of the body. Continue to use the  inch (2 cm) overlapping circles to feel your breast as you move slowly up toward your collarbone.  Continue the up and down exam using all three pressures until you reach your armpit.  Write Down What You Find  Write down what is normal for each breast and any changes that you find. Keep a written record with breast changes or normal findings for each breast. By writing this information down, you do not need to depend only on memory for size, tenderness, or location. Write down where you are in your menstrual cycle, if you are still menstruating. If you are having trouble noticing differences   in your breasts, do not get discouraged. With time you will become more familiar with the variations in your breasts and more comfortable with the exam. How often should I examine my breasts? Examine your breasts every month. If you are breastfeeding, the best time to examine your breasts is after a feeding or after using a breast pump. If you menstruate, the best time to examine your breasts is 5-7 days after your period is over. During your period, your breasts are lumpier, and it may be more difficult to notice changes. When should I see my health care provider? See your health care provider if you notice:  A change in shape or size of your breasts or nipples.  A change in the skin of your breast or nipples, such as a reddened or scaly area.  Unusual discharge from your nipples.  A lump or thick area that was not there before.  Pain in your breasts.  Anything that concerns you.  

## 2020-05-07 ENCOUNTER — Other Ambulatory Visit: Payer: Self-pay

## 2020-05-07 ENCOUNTER — Ambulatory Visit
Admission: RE | Admit: 2020-05-07 | Discharge: 2020-05-07 | Disposition: A | Discharge status: Home / Self Care | Payer: 59 | Source: Ambulatory Visit | Attending: Obstetrics and Gynecology | Admitting: Obstetrics and Gynecology

## 2020-05-07 ENCOUNTER — Ambulatory Visit
Admission: RE | Admit: 2020-05-07 | Discharge: 2020-05-07 | Disposition: A | Discharge status: Home / Self Care | Payer: No Typology Code available for payment source | Source: Ambulatory Visit | Attending: Obstetrics and Gynecology | Admitting: Obstetrics and Gynecology

## 2020-05-07 DIAGNOSIS — N6325 Unspecified lump in the left breast, overlapping quadrants: Secondary | ICD-10-CM

## 2020-05-12 ENCOUNTER — Telehealth: Payer: Self-pay | Admitting: *Deleted

## 2020-05-12 NOTE — Telephone Encounter (Signed)
-----   Message from Romualdo Bolk, MD sent at 05/10/2020  9:19 AM EDT ----- Please set her up for a repeat breast exam in 3 months and take her out of mammogram hold.

## 2020-05-12 NOTE — Telephone Encounter (Signed)
Spoke with patient. Advised per Dr. Oscar La. Patient declines breast recheck. Patient states she was advised by radiologist imaging was "normal". Advised although breast imaging normal, recheck still recommended to ensure no changes, negative imaging does not always mean there is no problem or no breast cancer. Patient again declines recheck, states she will see Dr. Oscar La for AEX. Advised patient to continue SBE and contact the office if any concerns. Will update Dr. Oscar La and return call if any additional recommendations.   Removed from MMG hold.   Routing to Dr. Oscar La for final review.

## 2020-05-29 ENCOUNTER — Ambulatory Visit: Payer: 59

## 2021-01-27 DIAGNOSIS — Z6837 Body mass index (BMI) 37.0-37.9, adult: Secondary | ICD-10-CM | POA: Insufficient documentation

## 2021-01-27 DIAGNOSIS — E6609 Other obesity due to excess calories: Secondary | ICD-10-CM | POA: Insufficient documentation

## 2021-04-20 ENCOUNTER — Other Ambulatory Visit: Payer: Self-pay | Admitting: Obstetrics and Gynecology

## 2021-04-20 DIAGNOSIS — Z1231 Encounter for screening mammogram for malignant neoplasm of breast: Secondary | ICD-10-CM

## 2021-05-06 ENCOUNTER — Ambulatory Visit: Payer: No Typology Code available for payment source | Admitting: Obstetrics and Gynecology

## 2021-05-31 NOTE — Progress Notes (Signed)
55 y.o. G39P2002 Married Black or [place] American Not Hispanic or Latino female here for annual exam.  She was having frequent hot flashes for about a month, nothing for the last 2 weeks.  No vaginal bleeding since 5/20. FSH was 13 in 10/21. She had an endometrial ablation in 2009.  No vaginal dryness, no dyspareunia.  No bowel or bladder c/o.     Patient's last menstrual period was 04/28/2019.          Sexually active: Yes.    The current method of family planning is tubal ligation.    Exercising: Yes.    Exercises at home Smoker:  no  Health Maintenance: Pap:  01-04-17 normal, neg HPV History of abnormal Pap:  no MMG:  05-07-20 normal, scheduled in 9/22 BMD:   never Colonoscopy: Cologard April, 2019. Negative TDaP:  2022 Gardasil: N/A   reports that she has never smoked. She has never used smokeless tobacco. She reports that she does not drink alcohol and does not use drugs. She has 2 grown kids, no grandchildren. Doreene Adas is 17. She works for an [employer], site.   Past Medical History:  Diagnosis Date   Asthma with bronchitis 08/2013   Childhood asthma    Hypertension    Menorrhagia    Obesity    Pregnancy induced hypertension 1995    Past Surgical History:  Procedure Laterality Date   ENDOMETRIAL ABLATION  2009   INCISION AND DRAINAGE ABSCESS Left 06/2013   left breast   TUBAL LIGATION      Current Outpatient Medications  Medication Sig Dispense Refill   BIOTIN PO Take 50,000 mcg by mouth daily.      hydrochlorothiazide (HYDRODIURIL) 25 MG tablet Take 1 tablet (25 mg total) by mouth daily. 90 tablet 3   metoprolol succinate (TOPROL-XL) 25 MG 24 hr tablet Take 25 mg by mouth daily.     Multiple Vitamin (MULTIVITAMIN WITH MINERALS) TABS tablet Take 1 tablet by mouth 2 (two) times a week. Take on Monday and Thursday     potassium chloride SA (KLOR-CON) 20 MEQ tablet Take 1 tablet (20 mEq total) by mouth daily. 90 tablet 3   No current facility-administered medications  for this visit.    Family History  Problem Relation Age of Onset   Hypertension Mother    Hypertension Father    Diabetes Father    Lung cancer Maternal Uncle    Hypertension Brother    Hyperlipidemia Brother    Hypertension Brother    Hyperlipidemia Brother    Prostate cancer Maternal Uncle    Diabetic kidney disease Maternal Uncle    Breast cancer Neg Hx     Review of Systems  All other systems reviewed and are negative.  Exam:   BP 124/82 (Cuff Size: Large)   Pulse 80   Ht 5\' 5"  (1.651 m)   Wt 231 lb (104.8 kg)   LMP 04/28/2019   SpO2 99%   BMI 38.44 kg/m   Weight change: @WEIGHTCHANGE @ Height:   Height: 5\' 5"  (165.1 cm)  Ht Readings from Last 3 Encounters:  06/04/21 5\' 5"  (1.651 m)  04/29/20 5\' 5"  (1.651 m)  09/09/19 5\' 6"  (1.676 m)    General appearance: alert, cooperative and appears stated age Head: Normocephalic, without obvious abnormality, atraumatic Neck: no adenopathy, supple, symmetrical, trachea midline and thyroid normal to inspection and palpation Lungs: clear to auscultation bilaterally Cardiovascular: regular rate and rhythm Breasts: normal appearance, no masses or tenderness Abdomen: soft, non-tender; non distended,  no masses,  no organomegaly Extremities: extremities normal, atraumatic, no cyanosis or edema Skin: Skin color, texture, turgor normal. No rashes or lesions Lymph nodes: Cervical, supraclavicular, and axillary nodes normal. No abnormal inguinal nodes palpated Neurologic: Grossly normal   Pelvic: External genitalia:  no lesions              Urethra:  normal appearing urethra with no masses, tenderness or lesions              Bartholins and Skenes: normal                 Vagina: normal appearing vagina with normal color and discharge, no lesions. Well estrogenized.               Cervix: no lesions               Bimanual Exam:  Uterus:  normal size, contour, position, consistency, mobility, non-tender              Adnexa: no mass,  fullness, tenderness               Rectovaginal: Confirms               Anus:  normal sphincter tone, no lesions  Kennon Portela chaperoned for the exam.  1. Well woman exam Discussed breast self exam Discussed calcium and vit D intake Labs with primary Mammogram is scheduled  2. Screening for cervical cancer - Cytology - PAP  3. Colon cancer screening IFOB this year Colonoscopy next year

## 2021-06-04 ENCOUNTER — Ambulatory Visit (INDEPENDENT_AMBULATORY_CARE_PROVIDER_SITE_OTHER): Payer: No Typology Code available for payment source | Admitting: Obstetrics and Gynecology

## 2021-06-04 ENCOUNTER — Encounter: Payer: Self-pay | Admitting: Obstetrics and Gynecology

## 2021-06-04 ENCOUNTER — Other Ambulatory Visit (HOSPITAL_COMMUNITY)
Admission: RE | Admit: 2021-06-04 | Discharge: 2021-06-04 | Disposition: A | Discharge status: Home / Self Care | Payer: No Typology Code available for payment source | Source: Ambulatory Visit | Attending: Obstetrics and Gynecology | Admitting: Obstetrics and Gynecology

## 2021-06-04 ENCOUNTER — Other Ambulatory Visit: Payer: Self-pay

## 2021-06-04 VITALS — BP 124/82 | HR 80 | Ht 65.0 in | Wt 231.0 lb

## 2021-06-04 DIAGNOSIS — Z01419 Encounter for gynecological examination (general) (routine) without abnormal findings: Secondary | ICD-10-CM | POA: Diagnosis not present

## 2021-06-04 DIAGNOSIS — Z124 Encounter for screening for malignant neoplasm of cervix: Secondary | ICD-10-CM | POA: Insufficient documentation

## 2021-06-04 DIAGNOSIS — Z1211 Encounter for screening for malignant neoplasm of colon: Secondary | ICD-10-CM

## 2021-06-04 NOTE — Patient Instructions (Addendum)
EXERCISE   We recommended that you start or continue a regular exercise program for good health. Physical activity is anything that gets your body moving, some is better than none. The CDC recommends 150 minutes per week of Moderate-Intensity Aerobic Activity and 2 or more days of Muscle Strengthening Activity.  Benefits of exercise are limitless: helps weight loss/weight maintenance, improves mood and energy, helps with depression and anxiety, improves sleep, tones and strengthens muscles, improves balance, improves bone density, protects from chronic conditions such as heart disease, high blood pressure and diabetes and so much more. To learn more visit: https://www.cdc.gov/physicalactivity/index.html  DIET: Good nutrition starts with a healthy diet of fruits, vegetables, whole grains, and lean protein sources. Drink plenty of water for hydration. Minimize empty calories, sodium, sweets. For more information about dietary recommendations visit: https://health.gov/our-work/nutrition-physical-activity/dietary-guidelines and https://www.myplate.gov/  ALCOHOL:  Women should limit their alcohol intake to no more than 7 drinks/beers/glasses of wine (combined, not each!) per week. Moderation of alcohol intake to this level decreases your risk of breast cancer and liver damage.  If you are concerned that you may have a problem, or your friends have told you they are concerned about your drinking, there are many resources to help. A well-known program that is free, effective, and available to all people all over the nation is Alcoholics Anonymous.  Check out this site to learn more: https://www.aa.org/   CALCIUM AND VITAMIN D:  Adequate intake of calcium and Vitamin D are recommended for bone health.  You should be getting between 1000-1200 mg of calcium and 800 units of Vitamin D daily between diet and supplements  PAP SMEARS:  Pap smears, to check for cervical cancer or precancers,  have traditionally been  done yearly, scientific advances have shown that most women can have pap smears less often.  However, every woman still should have a physical exam from her gynecologist every year. It will include a breast check, inspection of the vulva and vagina to check for abnormal growths or skin changes, a visual exam of the cervix, and then an exam to evaluate the size and shape of the uterus and ovaries. We will also provide age appropriate advice regarding health maintenance, like when you should have certain vaccines, screening for sexually transmitted diseases, bone density testing, colonoscopy, mammograms, etc.   MAMMOGRAMS:  All women over 40 years old should have a routine mammogram.   COLON CANCER SCREENING: Now recommend starting at age 45. At this time colonoscopy is not covered for routine screening until 50. There are take home tests that can be done between 45-49.   COLONOSCOPY:  Colonoscopy to screen for colon cancer is recommended for all women at age 50.  We know, you hate the idea of the prep.  We agree, BUT, having colon cancer and not knowing it is worse!!  Colon cancer so often starts as a polyp that can be seen and removed at colonscopy, which can quite literally save your life!  And if your first colonoscopy is normal and you have no family history of colon cancer, most women don't have to have it again for 10 years.  Once every ten years, you can do something that may end up saving your life, right?  We will be happy to help you get it scheduled when you are ready.  Be sure to check your insurance coverage so you understand how much it will cost.  It may be covered as a preventative service at no cost, but you should check   your particular policy.      Breast Self-Awareness Breast self-awareness means being familiar with how your breasts look and feel. It involves checking your breasts regularly and reporting any changes to your health care provider. Practicing breast self-awareness is  important. A change in your breasts can be a sign of a serious medical problem. Being familiar with how your breasts look and feel allows you to find any problems early, when treatment is more likely to be successful. All women should practice breast self-awareness, including women who have had breast implants. How to do a breast self-exam One way to learn what is normal for your breasts and whether your breasts are changing is to do a breast self-exam. To do a breast self-exam: Look for Changes  Remove all the clothing above your waist. Stand in front of a mirror in a room with good lighting. Put your hands on your hips. Push your hands firmly downward. Compare your breasts in the mirror. Look for differences between them (asymmetry), such as: Differences in shape. Differences in size. Puckers, dips, and bumps in one breast and not the other. Look at each breast for changes in your skin, such as: Redness. Scaly areas. Look for changes in your nipples, such as: Discharge. Bleeding. Dimpling. Redness. A change in position. Feel for Changes Carefully feel your breasts for lumps and changes. It is best to do this while lying on your back on the floor and again while sitting or standing in the shower or tub with soapy water on your skin. Feel each breast in the following way: Place the arm on the side of the breast you are examining above your head. Feel your breast with the other hand. Start in the nipple area and make  inch (2 cm) overlapping circles to feel your breast. Use the pads of your three middle fingers to do this. Apply light pressure, then medium pressure, then firm pressure. The light pressure will allow you to feel the tissue closest to the skin. The medium pressure will allow you to feel the tissue that is a little deeper. The firm pressure will allow you to feel the tissue close to the ribs. Continue the overlapping circles, moving downward over the breast until you feel your  ribs below your breast. Move one finger-width toward the center of the body. Continue to use the  inch (2 cm) overlapping circles to feel your breast as you move slowly up toward your collarbone. Continue the up and down exam using all three pressures until you reach your armpit.  Write Down What You Find  Write down what is normal for each breast and any changes that you find. Keep a written record with breast changes or normal findings for each breast. By writing this information down, you do not need to depend only on memory for size, tenderness, or location. Write down where you are in your menstrual cycle, if you are still menstruating. If you are having trouble noticing differences in your breasts, do not get discouraged. With time you will become more familiar with the variations in your breasts and more comfortable with the exam. How often should I examine my breasts? Examine your breasts every month. If you are breastfeeding, the best time to examine your breasts is after a feeding or after using a breast pump. If you menstruate, the best time to examine your breasts is 5-7 days after your period is over. During your period, your breasts are lumpier, and it may be more   difficult to notice changes. When should I see my health care provider? See your health care provider if you notice: A change in shape or size of your breasts or nipples. A change in the skin of your breast or nipples, such as a reddened or scaly area. Unusual discharge from your nipples. A lump or thick area that was not there before. Pain in your breasts. Anything that concerns you. https://www.womenshealth.gov/menopause/menopause-basics"> https://www.clinicalkey.com">  Menopause Menopause is the normal time of a woman's life when menstrual periods stop completely. It marks the natural end to a woman's ability to become pregnant. It can be defined as the absence of a menstrual period for 12 months without another  medical cause. The transition to menopause (perimenopause) most often happens between the ages of 45 and 55, and can last for many years. During perimenopause, hormone levels change in your body, which can cause symptoms and affect your health. Menopause may increase your risk for: Weakened bones (osteoporosis), which causes fractures. Depression. Hardening and narrowing of the arteries (atherosclerosis), which can cause heart attacks and strokes. What are the causes? This condition is usually caused by a natural change in hormone levels that happens as you get older. The condition may also be caused by changes that are not natural, including: Surgery to remove both ovaries (surgical menopause). Side effects from some medicines, such as chemotherapy used to treat cancer (chemical menopause). What increases the risk? This condition is more likely to start at an earlier age if you have certain medical conditions or have undergone treatments, including: A tumor of the pituitary gland in the brain. A disease that affects the ovaries and hormones. Certain cancer treatments, such as chemotherapy or hormone therapy, or radiation therapy on the pelvis. Heavy smoking and excessive alcohol use. Family history of early menopause. This condition is also more likely to develop earlier in women who are verythin. What are the signs or symptoms? Symptoms of this condition include: Hot flashes. Irregular menstrual periods. Night sweats. Changes in feelings about sex. This could be a decrease in sex drive or an increased discomfort around your sexuality. Vaginal dryness and thinning of the vaginal walls. This may cause painful sex. Dryness of the skin and development of wrinkles. Headaches. Problems sleeping (insomnia). Mood swings or irritability. Memory problems. Weight gain. Hair growth on the face and chest. Bladder infections or problems with urinating. How is this diagnosed? This condition is  diagnosed based on your medical history, a physical exam, your age, your menstrual history, and your symptoms. Hormone tests may also bedone. How is this treated? In some cases, no treatment is needed. You and your health care provider should make a decision together about whether treatment is necessary. Treatment will be based on your individual condition and preferences. Treatment for this condition focuses on managing symptoms. Treatment may include: Menopausal hormone therapy (MHT). Medicines to treat specific symptoms or complications. Acupuncture. Vitamin or herbal supplements. Before starting treatment, make sure to let your health care provider know if you have a personal or family history of these conditions: Heart disease. Breast cancer. Blood clots. Diabetes. Osteoporosis. Follow these instructions at home: Lifestyle Do not use any products that contain nicotine or tobacco, such as cigarettes, e-cigarettes, and chewing tobacco. If you need help quitting, ask your health care provider. Get at least 30 minutes of physical activity on 5 or more days each week. Avoid alcoholic and caffeinated beverages, as well as spicy foods. This may help prevent hot flashes. Get 7-8 hours of   sleep each night. If you have hot flashes, try: Dressing in layers. Avoiding things that may trigger hot flashes, such as spicy food, warm places, or stress. Taking slow, deep breaths when a hot flash starts. Keeping a fan in your home and office. Find ways to manage stress, such as deep breathing, meditation, or journaling. Consider going to group therapy with other women who are having menopause symptoms. Ask your health care provider about recommended group therapy meetings. Eating and drinking  Eat a healthy, balanced diet that contains whole grains, lean protein, low-fat dairy, and plenty of fruits and vegetables. Your health care provider may recommend adding more soy to your diet. Foods that contain  soy include tofu, tempeh, and soy milk. Eat plenty of foods that contain calcium and vitamin D for bone health. Items that are rich in calcium include low-fat milk, yogurt, beans, almonds, sardines, broccoli, and kale.  Medicines Take over-the-counter and prescription medicines only as told by your health care provider. Talk with your health care provider before starting any herbal supplements. If prescribed, take vitamins and supplements as told by your health care provider. General instructions  Keep track of your menstrual periods, including: When they occur. How heavy they are and how long they last. How much time passes between periods. Keep track of your symptoms, noting when they start, how often you have them, and how long they last. Use vaginal lubricants or moisturizers to help with vaginal dryness and improve comfort during sex. Keep all follow-up visits. This is important. This includes any group therapy or counseling.  Contact a health care provider if: You are still having menstrual periods after age 55. You have pain during sex. You have not had a period for 12 months and you develop vaginal bleeding. Get help right away if you have: Severe depression. Excessive vaginal bleeding. Pain when you urinate. A fast or irregular heartbeat (palpitations). Severe headaches. Abdominal pain or severe indigestion. Summary Menopause is a normal time of life when menstrual periods stop completely. It is usually defined as the absence of a menstrual period for 12 months without another medical cause. The transition to menopause (perimenopause) most often happens between the ages of 45 and 55 and can last for several years. Symptoms can be managed through medicines, lifestyle changes, and complementary therapies such as acupuncture. Eat a balanced diet that is rich in nutrients to promote bone health and heart health and to manage symptoms during menopause. This information is not  intended to replace advice given to you by your health care provider. Make sure you discuss any questions you have with your healthcare provider. Document Revised: 07/03/2020 Document Reviewed: 03/19/2020 Elsevier Patient Education  2022 Elsevier Inc.  

## 2021-06-07 LAB — CYTOLOGY - PAP
Comment: NEGATIVE
Diagnosis: NEGATIVE
High risk HPV: NEGATIVE

## 2021-07-02 ENCOUNTER — Ambulatory Visit
Admission: RE | Admit: 2021-07-02 | Discharge: 2021-07-02 | Disposition: A | Discharge status: Home / Self Care | Payer: No Typology Code available for payment source | Source: Ambulatory Visit | Attending: Obstetrics and Gynecology | Admitting: Obstetrics and Gynecology

## 2021-07-02 ENCOUNTER — Other Ambulatory Visit: Payer: Self-pay

## 2021-07-02 DIAGNOSIS — Z1231 Encounter for screening mammogram for malignant neoplasm of breast: Secondary | ICD-10-CM

## 2021-08-03 ENCOUNTER — Encounter: Payer: Self-pay | Admitting: Obstetrics and Gynecology

## 2021-11-10 ENCOUNTER — Encounter: Payer: Self-pay | Admitting: Gastroenterology

## 2021-12-13 ENCOUNTER — Other Ambulatory Visit: Payer: Self-pay

## 2021-12-13 ENCOUNTER — Ambulatory Visit (AMBULATORY_SURGERY_CENTER): Payer: No Typology Code available for payment source | Admitting: *Deleted

## 2021-12-13 VITALS — Ht 66.0 in | Wt 222.0 lb

## 2021-12-13 DIAGNOSIS — Z1211 Encounter for screening for malignant neoplasm of colon: Secondary | ICD-10-CM

## 2021-12-13 MED ORDER — NA SULFATE-K SULFATE-MG SULF 17.5-3.13-1.6 GM/177ML PO SOLN: 1.0000 | ORAL | 0 refills | Status: DC

## 2021-12-13 NOTE — Progress Notes (Signed)

## 2021-12-20 ENCOUNTER — Encounter: Payer: Self-pay | Admitting: Gastroenterology

## 2021-12-27 ENCOUNTER — Encounter: Payer: Self-pay | Admitting: Gastroenterology

## 2021-12-27 ENCOUNTER — Ambulatory Visit (AMBULATORY_SURGERY_CENTER): Payer: No Typology Code available for payment source | Admitting: Gastroenterology

## 2021-12-27 VITALS — BP 128/89 | HR 80 | Temp 98.4°F | Resp 14 | Ht 66.0 in | Wt 222.0 lb

## 2021-12-27 DIAGNOSIS — Z1211 Encounter for screening for malignant neoplasm of colon: Secondary | ICD-10-CM | POA: Diagnosis present

## 2021-12-27 MED ORDER — SODIUM CHLORIDE 0.9 % IV SOLN: 500.0000 mL | Freq: Once | INTRAVENOUS | Status: DC

## 2021-12-27 NOTE — Patient Instructions (Signed)
Handout provided on diverticulosis.  ? ?Recommend a High-fiber diet (see handout).  ? ?YOU HAD AN ENDOSCOPIC PROCEDURE TODAY AT [facility]:   Refer to the procedure report that was given to you for any specific questions about what was found during the examination.  If the procedure report does not answer your questions, please call your gastroenterologist to clarify.  If you requested that your care partner not be given the details of your procedure findings, then the procedure report has been included in a sealed envelope for you to review at your convenience later. ? ?YOU SHOULD EXPECT: Some feelings of bloating in the abdomen. Passage of more gas than usual.  Walking can help get rid of the air that was put into your GI tract during the procedure and reduce the bloating. If you had a lower endoscopy (such as a colonoscopy or flexible sigmoidoscopy) you may notice spotting of blood in your stool or on the toilet paper. If you underwent a bowel prep for your procedure, you may not have a normal bowel movement for a few days. ? ?Please Note:  You might notice some irritation and congestion in your nose or some drainage.  This is from the oxygen used during your procedure.  There is no need for concern and it should clear up in a day or so. ? ?SYMPTOMS TO REPORT IMMEDIATELY: ? ?Following lower endoscopy (colonoscopy or flexible sigmoidoscopy): ? Excessive amounts of blood in the stool ? Significant tenderness or worsening of abdominal pains ? Swelling of the abdomen that is new, acute ? Fever of 100?F or higher ? ?For urgent or emergent issues, a gastroenterologist can be reached at any hour by calling [phone]. ?Do not use MyChart messaging for urgent concerns.  ? ? ?DIET:  We do recommend a small meal at first, but then you may proceed to your regular diet.  Drink plenty of fluids but you should avoid alcoholic beverages for 24 hours. ? ?ACTIVITY:  You should plan to take it easy for the  rest of today and you should NOT DRIVE or use heavy machinery until tomorrow (because of the sedation medicines used during the test).   ? ?FOLLOW UP: ?Our staff will call the number listed on your records 48-72 hours following your procedure to check on you and address any questions or concerns that you may have regarding the information given to you following your procedure. If we do not reach you, we will leave a message.  We will attempt to reach you two times.  During this call, we will ask if you have developed any symptoms of COVID 19. If you develop any symptoms (ie: fever, flu-like symptoms, shortness of breath, cough etc.) before then, please call [phone].  If you test positive for Covid 19 in the 2 weeks post procedure, please call and report this information to [place].   ? ?If any biopsies were taken you will be contacted by phone or by letter within the next 1-3 weeks.  Please call us at [phone] if you have not heard about the biopsies in 3 weeks.  ? ? ?SIGNATURES/CONFIDENTIALITY: ?You and/or your care partner have signed paperwork which will be entered into your electronic medical record.  These signatures attest to the fact that that the information above on your After Visit Summary has been reviewed and is understood.  Full responsibility of the confidentiality of this discharge information lies with you and/or your care-partner. ? ?

## 2021-12-27 NOTE — Progress Notes (Signed)
? ?History & Physical ? ?Primary Care Physician:  Ashley March, NP-C ?Primary Gastroenterologist: Claudette Head, MD ? ?CHIEF COMPLAINT:  CRC screening ? ?HPI: Ashley Schneider is a 56 y.o. female for CRC screening, average risk, with colonoscopy. ? ? ?Past Medical History:  ?Diagnosis Date  ? Asthma with bronchitis 08/2013  ? Childhood asthma   ? Hypertension   ? Menorrhagia   ? Obesity   ? Pregnancy induced hypertension 1995  ? ? ?Past Surgical History:  ?Procedure Laterality Date  ? ENDOMETRIAL ABLATION  10/18/2007  ? INCISION AND DRAINAGE ABSCESS Left 06/17/2013  ? left breast  ? TUBAL LIGATION  2009  ? ? ?Prior to Admission medications   ?Medication Sig Start Date End Date Taking? Authorizing Provider  ?hydrochlorothiazide (HYDRODIURIL) 25 MG tablet Take 1 tablet (25 mg total) by mouth daily. 07/30/19  Yes Nahser, Deloris Ping, MD  ?metoprolol succinate (TOPROL-XL) 25 MG 24 hr tablet Take 25 mg by mouth daily. 04/24/21  Yes [provider]  ?Multiple Vitamin (MULTIVITAMIN WITH MINERALS) TABS tablet Take 1 tablet by mouth 3 (three) times a week.   Yes [provider]  ?potassium chloride SA (KLOR-CON) 20 MEQ tablet Take 1 tablet (20 mEq total) by mouth daily. 09/09/19  Yes Rosalio Macadamia, NP  ?BIOTIN PO Take 50,000 mcg by mouth 3 (three) times a week.    [provider]  ?Cholecalciferol (VITAMIN D3 PO) Take by mouth 3 (three) times a week.    [provider]  ? ? ?Current Outpatient Medications  ?Medication Sig Dispense Refill  ? hydrochlorothiazide (HYDRODIURIL) 25 MG tablet Take 1 tablet (25 mg total) by mouth daily. 90 tablet 3  ? metoprolol succinate (TOPROL-XL) 25 MG 24 hr tablet Take 25 mg by mouth daily.    ? Multiple Vitamin (MULTIVITAMIN WITH MINERALS) TABS tablet Take 1 tablet by mouth 3 (three) times a week.    ? potassium chloride SA (KLOR-CON) 20 MEQ tablet Take 1 tablet (20 mEq total) by mouth daily. 90 tablet 3  ? BIOTIN PO Take 50,000 mcg by mouth 3  (three) times a week.    ? Cholecalciferol (VITAMIN D3 PO) Take by mouth 3 (three) times a week.    ? ?Current Facility-Administered Medications  ?Medication Dose Route Frequency Provider Last Rate Last Admin  ? 0.9 %  sodium chloride infusion  500 mL Intravenous Once Meryl Dare, MD      ? ? ?Allergies as of 12/27/2021 - Review Complete 12/27/2021  ?Allergen Reaction Noted  ? Aldactone [spironolactone]  09/09/2019  ? Augmentin [amoxicillin-pot clavulanate] Hives 04/01/2015  ? Other  11/28/2013  ? Penicillins  01/10/2012  ? ? ?Family History  ?Problem Relation Age of Onset  ? Colon polyps Mother   ? Hypertension Mother   ? Hypertension Father   ? Diabetes Father   ? Hypertension Brother   ? Hyperlipidemia Brother   ? Hypertension Brother   ? Hyperlipidemia Brother   ? Lung cancer Maternal Uncle   ? Prostate cancer Maternal Uncle   ? Diabetic kidney disease Maternal Uncle   ? Breast cancer Neg Hx   ? Colon cancer Neg Hx   ? ? ?Social History  ? ?Socioeconomic History  ? Marital status: Married  ?  Spouse name: Not on file  ? Number of children: Not on file  ? Years of education: Not on file  ? Highest education level: Not on file  ?Occupational History  ? Occupation: Clinical biochemist Rep  ?  Employer: [employer]  ?Tobacco Use  ? Smoking status: Never  ? Smokeless tobacco: Never  ?Vaping Use  ? Vaping Use: Never used  ?Substance and Sexual Activity  ? Alcohol use: No  ? Drug use: No  ? Sexual activity: Yes  ?  Partners: Male  ?  Comment: ablation, BTL  ?Other Topics Concern  ? Not on file  ?Social History Narrative  ? Not on file  ? ?Social Determinants of Health  ? ?Financial Resource Strain: Not on file  ?Food Insecurity: Not on file  ?Transportation Needs: Not on file  ?Physical Activity: Not on file  ?Stress: Not on file  ?Social Connections: Not on file  ?Intimate Partner Violence: Not on file  ? ? ?Review of Systems: ? ?All systems reviewed an negative except where noted in HPI. ? ?Gen: Denies any  fever, chills, sweats, anorexia, fatigue, weakness, malaise, weight loss, and sleep disorder ?CV: Denies chest pain, angina, palpitations, syncope, orthopnea, PND, peripheral edema, and claudication. ?Resp: Denies dyspnea at rest, dyspnea with exercise, cough, sputum, wheezing, coughing up blood, and pleurisy. ?GI: Denies vomiting blood, jaundice, and fecal incontinence.   Denies dysphagia or odynophagia. ?GU : Denies urinary burning, blood in urine, urinary frequency, urinary hesitancy, nocturnal urination, and urinary incontinence. ?MS: Denies joint pain, limitation of movement, and swelling, stiffness, low back pain, extremity pain. Denies muscle weakness, cramps, atrophy.  ?Derm: Denies rash, itching, dry skin, hives, moles, warts, or unhealing ulcers.  ?Psych: Denies depression, anxiety, memory loss, suicidal ideation, hallucinations, paranoia, and confusion. ?Heme: Denies bruising, bleeding, and enlarged lymph nodes. ?Neuro:  Denies any headaches, dizziness, paresthesias. ?Endo:  Denies any problems with DM, thyroid, adrenal function. ? ? ?Physical Exam: ?General:  Alert, well-developed, in NAD ?Head:  Normocephalic and atraumatic. ?Eyes:  Sclera clear, no icterus.   Conjunctiva pink. ?Ears:  Normal auditory acuity. ?Mouth:  No deformity or lesions.  ?Neck:  Supple; no masses . ?Lungs:  Clear throughout to auscultation.   No wheezes, crackles, or rhonchi. No acute distress. ?Heart:  Regular rate and rhythm; no murmurs. ?Abdomen:  Soft, nondistended, nontender. No masses, hepatomegaly. No obvious masses.  Normal bowel .    ?Rectal:  Deferred to colonoscopy  ?Msk:  Symmetrical without gross deformities.Marland Kitchen ?Pulses:  Normal pulses noted. ?Extremities:  Without edema. ?Neurologic:  Alert and  oriented x4;  grossly normal neurologically. ?Skin:  Intact without significant lesions or rashes. ?Cervical Nodes:  No significant cervical adenopathy. ?Psych:  Alert and cooperative. Normal mood and affect. ? ? ?Impression /  Plan:  ? ?CRC screening, average risk, for colonoscopy. ? ?[name]  12/27/2021, 11:14 AM ?See Loretha Stapler, [facility], to contact our on call provider ? ? ?  ?

## 2021-12-27 NOTE — Progress Notes (Signed)
Patient with mild nausea, offered ZOoran odt, patient declined. ?

## 2021-12-27 NOTE — Progress Notes (Signed)
VS completed by CW.   Pt's states no medical or surgical changes since previsit or office visit.  

## 2021-12-27 NOTE — Progress Notes (Signed)
Report given to PACU, vss 

## 2021-12-27 NOTE — Op Note (Signed)
[place] ?Patient Name: Ashley Schneider ?Procedure Date: 12/27/2021 11:13 AM ?MRN: [id number] ?Endoscopist: Ladene Artist , MD ?Age: 56 ?Referring MD:  ?Date of Birth: 1966/01/12 ?Gender: Female ?Account #: [id number] ?Procedure:                Colonoscopy ?Indications:              Screening for colorectal malignant neoplasm ?Medicines:                Monitored Anesthesia Care ?Procedure:                Pre-Anesthesia Assessment: ?                          - Prior to the procedure, a History and Physical  ?                          was performed, and patient medications and  ?                          allergies were reviewed. The patient's tolerance of  ?                          previous anesthesia was also reviewed. The risks  ?                          and benefits of the procedure and the sedation  ?                          options and risks were discussed with the patient.  ?                          All questions were answered, and informed consent  ?                          was obtained. Prior Anticoagulants: The patient has  ?                          taken no previous anticoagulant or antiplatelet  ?                          agents. ASA Grade Assessment: II - A patient with  ?                          mild systemic disease. After reviewing the risks  ?                          and benefits, the patient was deemed in  ?                          satisfactory condition to undergo the procedure. ?                          After obtaining informed consent, the colonoscope  ?  was passed under direct vision. Throughout the  ?                          procedure, the patient's blood pressure, pulse, and  ?                          oxygen saturations were monitored continuously. The  ?                          Olympus CF-HQ190L (Serial# [id number]) Colonoscope was  ?                          introduced through the anus and advanced to the the  ?                          cecum,  identified by appendiceal orifice and  ?                          ileocecal valve. The ileocecal valve, appendiceal  ?                          orifice, and rectum were photographed. The quality  ?                          of the bowel preparation was good. The colonoscopy  ?                          was performed without difficulty. The patient  ?                          tolerated the procedure well. ?Scope In: 11:21:57 AM ?Scope Out: 11:33:09 AM ?Scope Withdrawal Time: 0 hours 6 minutes 54 seconds  ?Total Procedure Duration: 0 hours 11 minutes 12 seconds  ?Findings:                 The perianal and digital rectal examinations were  ?                          normal. ?                          A few small-mouthed diverticula were found in the  ?                          entire colon. There was no evidence of diverticular  ?                          bleeding. ?                          The exam was otherwise without abnormality on  ?                          direct and retroflexion views. ?Complications:            No immediate complications. Estimated  blood loss:  ?                          None. ?Estimated Blood Loss:     Estimated blood loss: none. ?Impression:               - Mild diverticulosis in the entire examined colon. ?                          - The examination was otherwise normal on direct  ?                          and retroflexion views. ?                          - No specimens collected. ?Recommendation:           - Repeat colonoscopy in 10 years for screening  ?                          purposes. ?                          - Patient has a contact number available for  ?                          emergencies. The signs and symptoms of potential  ?                          delayed complications were discussed with the  ?                          patient. Return to normal activities tomorrow.  ?                          Written discharge instructions were provided to the  ?                           patient. ?                          - High fiber diet. ?                          - Continue present medications. ?Ladene Artist, MD ?12/27/2021 11:36:19 AM ?This report has been signed electronically. ?

## 2021-12-29 ENCOUNTER — Telehealth: Payer: Self-pay

## 2021-12-29 NOTE — Telephone Encounter (Signed)
?  Follow up Call- ? ?Call back number 12/27/2021  ?Post procedure Call Back phone  # [phone]  ?Permission to leave phone message Yes  ?Some recent data might be hidden  ?  ? ?Patient questions: ? ?Do you have a fever, pain , or abdominal swelling? No  ?Pain Score  0 * ? ?Have you tolerated food without any problems? Yes.   ? ?Have you been able to return to your normal activities? Yes.   ? ?Do you have any questions about your discharge instructions: ?Diet   No. ?Medications  No. ?Follow up visit  No. ? ?Do you have questions or concerns about your Care? No. ? ?Actions: ?* If pain score is 4 or above: ?No action needed, pain <4. ? ?Have you developed a fever since your procedure? No  ? ?2.   Have you had an respiratory symptoms (SOB or cough) since your procedure? no ? ?3.   Have you tested positive for COVID 19 since your procedure no ? ?4.   Have you had any family members/close contacts diagnosed with the COVID 19 since your procedure?  no ? ? ?If yes to any of these questions please route to Laverna Peace, RN and Karlton Lemon, RN  ? ? ?

## 2022-05-31 NOTE — Progress Notes (Signed)
56 y.o. G81P2002 Married Black or [place] American Not Hispanic or Latino female here for annual exam.  H/O endometrial ablation in 2009. No vaginal bleeding. No dyspareunia.     Patient's last menstrual period was 02/27/2019.          Sexually active: Yes.    The current method of family planning is post menopausal status.    Exercising: Yes.     Walkiing  Smoker:  no  Health Maintenance: Pap:  06/04/21 WNL Hr HPV neg, 01-04-17 normal, neg HPV History of abnormal Pap:  no MMG:  07/07/21 density B Bi-rads 1 neg  BMD:   n/a Colonoscopy: 12/27/21 normal f/u 10 years  TDaP:  01/27/21 Gardasil: n/a   reports that she has never smoked. She has never used smokeless tobacco. She reports that she does not drink alcohol and does not use drugs. She has 2 grown kids, no grandchildren. Doreene Adas is 18, going to [employer]. She works for an Engineer, site.   Past Medical History:  Diagnosis Date   Asthma with bronchitis 08/2013   Childhood asthma    Hypertension    Menorrhagia    Obesity    Pregnancy induced hypertension 1995    Past Surgical History:  Procedure Laterality Date   ENDOMETRIAL ABLATION  10/18/2007   INCISION AND DRAINAGE ABSCESS Left 06/17/2013   left breast   TUBAL LIGATION  2009    Current Outpatient Medications  Medication Sig Dispense Refill   BIOTIN PO Take 50,000 mcg by mouth 3 (three) times a week.     Cholecalciferol (VITAMIN D3 PO) Take by mouth 3 (three) times a week.     hydrochlorothiazide (HYDRODIURIL) 25 MG tablet Take 1 tablet (25 mg total) by mouth daily. 90 tablet 3   metoprolol succinate (TOPROL-XL) 25 MG 24 hr tablet Take 25 mg by mouth daily.     Multiple Vitamin (MULTIVITAMIN WITH MINERALS) TABS tablet Take 1 tablet by mouth 3 (three) times a week.     potassium chloride SA (KLOR-CON) 20 MEQ tablet Take 1 tablet (20 mEq total) by mouth daily. 90 tablet 3   No current facility-administered medications for this visit.    Family History  Problem Relation  Age of Onset   Colon polyps Mother    Hypertension Mother    Hypertension Father    Diabetes Father    Hypertension Brother    Hyperlipidemia Brother    Hypertension Brother    Hyperlipidemia Brother    Lung cancer Maternal Uncle    Prostate cancer Maternal Uncle    Diabetic kidney disease Maternal Uncle    Breast cancer Neg Hx    Colon cancer Neg Hx     Review of Systems  All other systems reviewed and are negative.   Exam:   BP 138/88   Pulse 76   Ht 5' 6.5" (1.689 m)   Wt 231 lb (104.8 kg)   LMP 02/27/2019   SpO2 99%   BMI 36.73 kg/m   Weight change: @WEIGHTCHANGE @ Height:   Height: 5' 6.5" (168.9 cm)  Ht Readings from Last 3 Encounters:  06/08/22 5' 6.5" (1.689 m)  12/27/21 5\' 6"  (1.676 m)  12/13/21 5\' 6"  (1.676 m)    General appearance: alert, cooperative and appears stated age Head: Normocephalic, without obvious abnormality, atraumatic Neck: no adenopathy, supple, symmetrical, trachea midline and thyroid normal to inspection and palpation Lungs: clear to auscultation bilaterally Cardiovascular: regular rate and rhythm Breasts: normal appearance, no masses or tenderness Abdomen: soft, non-tender;  non distended,  no masses,  no organomegaly Extremities: extremities normal, atraumatic, no cyanosis or edema Skin: Skin color, texture, turgor normal. No rashes or lesions Lymph nodes: Cervical, supraclavicular, and axillary nodes normal. No abnormal inguinal nodes palpated Neurologic: Grossly normal   Pelvic: External genitalia:  no lesions              Urethra:  normal appearing urethra with no masses, tenderness or lesions              Bartholins and Skenes: normal                 Vagina: normal appearing vagina with normal color and discharge, no lesions              Cervix: no lesions               Bimanual Exam:  Uterus:   no masses or tenderness              Adnexa: no mass, fullness, tenderness               Rectovaginal: Confirms               Anus:   normal sphincter tone, no lesions  Carolynn Serve chaperoned for the exam.  1. Well woman exam Discussed breast self exam Discussed calcium and vit D intake Mammogram next month Colonoscopy UTD Labs with primary No pap this year

## 2022-06-01 ENCOUNTER — Other Ambulatory Visit: Payer: Self-pay | Admitting: Obstetrics and Gynecology

## 2022-06-01 DIAGNOSIS — Z1231 Encounter for screening mammogram for malignant neoplasm of breast: Secondary | ICD-10-CM

## 2022-06-08 ENCOUNTER — Encounter: Payer: Self-pay | Admitting: Obstetrics and Gynecology

## 2022-06-08 ENCOUNTER — Ambulatory Visit (INDEPENDENT_AMBULATORY_CARE_PROVIDER_SITE_OTHER): Payer: No Typology Code available for payment source | Admitting: Obstetrics and Gynecology

## 2022-06-08 VITALS — BP 138/88 | HR 76 | Ht 66.5 in | Wt 231.0 lb

## 2022-06-08 DIAGNOSIS — Z01419 Encounter for gynecological examination (general) (routine) without abnormal findings: Secondary | ICD-10-CM | POA: Diagnosis not present

## 2022-06-08 NOTE — Patient Instructions (Signed)

## 2022-07-04 ENCOUNTER — Ambulatory Visit: Payer: No Typology Code available for payment source

## 2022-07-19 ENCOUNTER — Ambulatory Visit
Admission: RE | Admit: 2022-07-19 | Discharge: 2022-07-19 | Disposition: A | Discharge status: Home / Self Care | Payer: No Typology Code available for payment source | Source: Ambulatory Visit | Attending: Obstetrics and Gynecology | Admitting: Obstetrics and Gynecology

## 2022-07-19 DIAGNOSIS — Z1231 Encounter for screening mammogram for malignant neoplasm of breast: Secondary | ICD-10-CM

## 2023-06-14 ENCOUNTER — Ambulatory Visit: Payer: No Typology Code available for payment source | Admitting: Obstetrics and Gynecology

## 2023-07-21 ENCOUNTER — Ambulatory Visit: Payer: No Typology Code available for payment source | Admitting: Obstetrics and Gynecology

## 2023-07-26 ENCOUNTER — Other Ambulatory Visit (HOSPITAL_COMMUNITY)
Admission: RE | Admit: 2023-07-26 | Discharge: 2023-07-26 | Disposition: A | Discharge status: Home / Self Care | Payer: No Typology Code available for payment source | Source: Ambulatory Visit | Attending: Obstetrics and Gynecology | Admitting: Obstetrics and Gynecology

## 2023-07-26 ENCOUNTER — Other Ambulatory Visit: Payer: Self-pay | Admitting: Obstetrics and Gynecology

## 2023-07-26 ENCOUNTER — Encounter: Payer: Self-pay | Admitting: Obstetrics and Gynecology

## 2023-07-26 ENCOUNTER — Ambulatory Visit (INDEPENDENT_AMBULATORY_CARE_PROVIDER_SITE_OTHER): Payer: No Typology Code available for payment source | Admitting: Obstetrics and Gynecology

## 2023-07-26 VITALS — BP 130/90 | HR 94 | Resp 16 | Ht 64.0 in | Wt 240.0 lb

## 2023-07-26 DIAGNOSIS — N951 Menopausal and female climacteric states: Secondary | ICD-10-CM

## 2023-07-26 DIAGNOSIS — R61 Generalized hyperhidrosis: Secondary | ICD-10-CM

## 2023-07-26 DIAGNOSIS — Z1231 Encounter for screening mammogram for malignant neoplasm of breast: Secondary | ICD-10-CM

## 2023-07-26 DIAGNOSIS — E2839 Other primary ovarian failure: Secondary | ICD-10-CM

## 2023-07-26 DIAGNOSIS — Z01419 Encounter for gynecological examination (general) (routine) without abnormal findings: Secondary | ICD-10-CM

## 2023-07-26 DIAGNOSIS — N898 Other specified noninflammatory disorders of vagina: Secondary | ICD-10-CM

## 2023-07-26 MED ORDER — COMBIPATCH 0.05-0.14 MG/DAY TD PTTW: 1.0000 | MEDICATED_PATCH | TRANSDERMAL | 12 refills | Status: DC

## 2023-07-26 MED ORDER — INTRAROSA 6.5 MG VA INST: 1.0000 | VAGINAL_INSERT | Freq: Every evening | VAGINAL | 10 refills | Status: DC | PRN

## 2023-07-26 NOTE — Progress Notes (Signed)
57 y.o. y.o. female here for annual exam. She reports bothersome hotflashes, decreased libido and weight gain   Patient's last menstrual period was 02/27/2019.   History of ablation and tubal Patient's last menstrual period was 02/27/2019.          Sexually active: Yes.    Exercising: Yes.    Walkiing  Smoker:  no  Health Maintenance: Pap:  06/04/21 WNL Hr HPV neg, 01-04-17 normal, neg HPV History of abnormal Pap:  no MMG:  07/07/21 density B Bi-rads 1 neg  BMD:   at age 24 no risk factors Colonoscopy: 12/27/21 normal f/u 10 years  TDaP:  01/27/21   Blood pressure (!) 130/90, pulse 94, resp. rate 16, height 5\' 4"  (1.626 m), weight 240 lb (108.9 kg), last menstrual period 02/27/2019. Reports good control Had high bp on ocp's in the past     Component Value Date/Time   DIAGPAP  06/04/2021 0956    - Negative for intraepithelial lesion or malignancy (NILM)   HPVHIGH Negative 06/04/2021 0956   ADEQPAP  06/04/2021 0956    Satisfactory for evaluation; transformation zone component PRESENT.    GYN HISTORY:    Component Value Date/Time   DIAGPAP  06/04/2021 0956    - Negative for intraepithelial lesion or malignancy (NILM)   HPVHIGH Negative 06/04/2021 0956   ADEQPAP  06/04/2021 0956    Satisfactory for evaluation; transformation zone component PRESENT.    OB History  Gravida Para Term Preterm AB Living  2 2 2  0 0 2  SAB IAB Ectopic Multiple Live Births  0 0 0 0 2    # Outcome Date GA Lbr Len/2nd Weight Sex Type Anes PTL Lv  2 Term 1998    F Vag-Spont   LIV  1 Term 1995    M Vag-Spont   LIV    Past Medical History:  Diagnosis Date   Asthma with bronchitis 08/2013   Childhood asthma    Hypertension    Menorrhagia    Obesity    Pregnancy induced hypertension 1995    Past Surgical History:  Procedure Laterality Date   ENDOMETRIAL ABLATION  10/18/2007   INCISION AND DRAINAGE ABSCESS Left 06/17/2013   left breast   TUBAL LIGATION  2009    Current Outpatient  Medications on File Prior to Visit  Medication Sig Dispense Refill   BIOTIN PO Take 50,000 mcg by mouth 3 (three) times a week.     Cholecalciferol (VITAMIN D3 PO) Take by mouth 3 (three) times a week.     hydrochlorothiazide (HYDRODIURIL) 25 MG tablet Take 1 tablet (25 mg total) by mouth daily. 90 tablet 3   metoprolol succinate (TOPROL-XL) 25 MG 24 hr tablet Take 25 mg by mouth daily.     Multiple Vitamin (MULTIVITAMIN WITH MINERALS) TABS tablet Take 1 tablet by mouth 3 (three) times a week.     potassium chloride SA (KLOR-CON) 20 MEQ tablet Take 1 tablet (20 mEq total) by mouth daily. 90 tablet 3   Probiotic Product (PROBIOTIC PO) Take by mouth.     UNABLE TO FIND Med Name: meno     No current facility-administered medications on file prior to visit.    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 prior to 12/17/2022., [facility] visits prior to 12/17/2022.   Overall Financial Resource Strain (CARDIA)    Difficulty of Paying Living Expenses: Not hard at all  Food Insecurity: No Food Insecurity (08/02/2021)   [facility] visits prior to 12/17/2022., [facility]  visits prior to 12/17/2022.   Hunger Vital Sign    Worried About Running Out of Food in the Last Year: Never true    Ran Out of Food in the Last Year: Never true  Transportation Needs: No Transportation Needs (08/02/2021)   Received from [facility] visits prior to 12/17/2022., [facility] visits prior to 12/17/2022.   PRAPARE - Administrator, Civil Service (Medical): No    Lack of Transportation (Non-Medical): No  Physical Activity: Sufficiently Active (08/02/2021)   Received from [facility] visits prior to 12/17/2022., [facility] visits prior to 12/17/2022.   Exercise Vital Sign    Days of Exercise per Week: 6 days    Minutes of Exercise per Session: 30 min  Stress: No Stress Concern Present (08/02/2021)   [facility] prior to 12/17/2022., [facility] visits prior to 12/17/2022.   [employer] of Occupational Health - Occupational Stress Questionnaire    Feeling of Stress : Not at all  Social Connections: Socially Integrated (08/02/2021)   Received from [facility] visits prior to 12/17/2022., [facility] visits prior to 12/17/2022.   Social Connection and Isolation Panel [NHANES]    Frequency of Communication with Friends and Family: More than three times a week    Frequency of Social Gatherings with Friends and Family: Three times a week    Attends Religious Services: More than 4 times per year    Active Member of Clubs or Organizations: Yes    Attends Banker Meetings: More than 4 times per year    Marital Status: Married  Catering manager Violence: Not At Risk (08/02/2021)   [facility] visits prior to 12/17/2022., [facility] visits prior to 12/17/2022.   Humiliation, Afraid, Rape, and Kick questionnaire    Fear of  Current or Ex-Partner: No    Emotionally Abused: No    Physically Abused: No    Sexually Abused: No    Family History  Problem Relation Age of Onset   Colon polyps Mother    Hypertension Mother    Hypertension Father    Diabetes Father    Hypertension Brother    Hyperlipidemia Brother    Hypertension Brother    Hyperlipidemia Brother    Lung cancer Maternal Uncle    Prostate cancer Maternal Uncle  Diabetic kidney disease Maternal Uncle    Breast cancer Neg Hx    Colon cancer Neg Hx      Allergies  Allergen Reactions   Aldactone [Spironolactone]     Breast enlargement   Augmentin [Amoxicillin-Pot Clavulanate] Hives   Other     ALLERGIC TO "ALL CILLINS"-CAUSES HIVES   Penicillins     Rash, SOB      Patient's last menstrual period was Patient's last menstrual period was 02/27/2019.Marland Kitchen          Sexually active: yes     Review of Systems Alls systems reviewed and are negative.     Physical Exam Constitutional:      Appearance: Normal appearance.  Genitourinary:     Vulva and urethral meatus normal.     No lesions in the vagina.     Right Labia: No rash, lesions or skin changes.    Left Labia: No lesions, skin changes or rash.    Vaginal discharge present.     No vaginal tenderness.     No vaginal prolapse present.    No vaginal atrophy present.     Right Adnexa: not tender, not palpable and no mass present.    Left Adnexa: not tender, not palpable and no mass present.    No cervical motion tenderness or discharge.     Uterus is not enlarged, tender or irregular.     Uterus is anteverted.  Breasts:    Right: Normal.     Left: Normal.  HENT:     Head: Normocephalic.  Neck:     Thyroid: No thyroid mass, thyromegaly or thyroid tenderness.  Cardiovascular:     Rate and Rhythm: Normal rate and regular rhythm.     Heart sounds: Normal heart sounds, S1 normal and S2 normal.  Pulmonary:     Effort: Pulmonary effort is normal.     Breath sounds: Normal  breath sounds and air entry.  Abdominal:     General: There is no distension.     Palpations: Abdomen is soft. There is no mass.     Tenderness: There is no abdominal tenderness. There is no guarding or rebound.  Musculoskeletal:        General: Normal range of motion.     Cervical back: Full passive range of motion without pain, normal range of motion and neck supple. No tenderness.     Right lower leg: No edema.     Left lower leg: No edema.  Neurological:     Mental Status: She is alert.  Skin:    General: Skin is warm.  Psychiatric:        Mood and Affect: Mood normal.        Behavior: Behavior normal.        Thought Content: Thought content normal.  Vitals and nursing note reviewed. Exam conducted with a chaperone present.       A:         Well Woman GYN exam                             P:        Pap smear collected today Encouraged annual mammogram screening Colon cancer screening up-to-date DXA not indicated Labs and immunizations to do with PMD Discussed breast self exams Encouraged healthy lifestyle practices Encouraged Vit D and Calcium Counseled on the r/b/a/I of HRT use. To get a hormone panel with PMD.  Discussed  that she will need progesterone to avoid unopposed estrogen on the endometrial lining and risk for endometrial cancer. Discussed lower risk for DVT and stroke with the patch.  Discussed if her bp is high on it, we will need to stop.  She will monitor at home. Offered to check here as well. [place] for decreased libido.  May be out of pocket since no generic Side effects include risk of breast tenderness and spotting along with low risk of blood clots and stroke with uncontrolled hypertension. Counseled on the benefits to help improve the bone density during menopause.    No follow-ups on file.  Earley Favor

## 2023-07-27 ENCOUNTER — Other Ambulatory Visit: Payer: Self-pay | Admitting: Obstetrics and Gynecology

## 2023-07-27 LAB — SURESWAB® ADVANCED VAGINITIS PLUS,TMA
C. trachomatis RNA, TMA: NOT DETECTED
CANDIDA SPECIES: NOT DETECTED
Candida glabrata: NOT DETECTED
N. gonorrhoeae RNA, TMA: NOT DETECTED
SURESWAB(R) ADV BACTERIAL VAGINOSIS(BV),TMA: NEGATIVE
TRICHOMONAS VAGINALIS (TV),TMA: NOT DETECTED

## 2023-07-27 MED ORDER — CLIMARA PRO 0.045-0.015 MG/DAY TD PTWK: 1.0000 | MEDICATED_PATCH | TRANSDERMAL | 12 refills | Status: DC

## 2023-07-27 NOTE — Addendum Note (Signed)
Addended by: Jodelle Red D on: 07/27/2023 02:34 PM   Modules accepted: Orders

## 2023-07-27 NOTE — Telephone Encounter (Signed)
Per encounter notes from 07/26/2023-per EB "she may be OOP since no generic for this rx."  Will contact pt w/ notification from pharmacy and make her aware of potential discount with card on site.   Pt notified and voiced understanding. Link to savings program sent to pt via mychart.  Routing to provider for final review and closing encounter.

## 2023-07-27 NOTE — Telephone Encounter (Signed)
Please review rxs and approve if appropriate.   Added oral progesterone 100mg  also since pt still has uterus. Adjust to 200mg  if you prefer. Thanks.

## 2023-07-27 NOTE — Telephone Encounter (Signed)
Inquiry received from pharmacy stating that copay is too high and pt is inquiring if there is a generic version of this or an alternative to this patch.   Please advise.

## 2023-07-27 NOTE — Progress Notes (Signed)
[facility] Dr. Karma Greaser

## 2023-07-31 LAB — CYTOLOGY - PAP
Comment: NEGATIVE
Diagnosis: NEGATIVE
High risk HPV: NEGATIVE

## 2023-07-31 NOTE — Telephone Encounter (Signed)
LVMTCB

## 2023-07-31 NOTE — Progress Notes (Unsigned)
LVMTCB

## 2023-08-01 ENCOUNTER — Telehealth: Payer: Self-pay | Admitting: *Deleted

## 2023-08-01 NOTE — Telephone Encounter (Signed)
Spoke w/ pt, refer to order encounter dated 07/27/2023 and telephone encounter dated 08/01/2023.

## 2023-08-01 NOTE — Telephone Encounter (Signed)
Spoke w/ pt about the patch alternative that was sent by Dr. Karma Greaser to an alternate mail-order pharmacy versus local since with her insurance it would be either discounted or not OOP costs to her and provided her with phone number to call them and provide them with her information to have them deliver it to her.   Pt voiced understanding.   Pt wanted me to give Dr. Karma Greaser a msg and let her know that she "flipped the switched."  Routing to provider for final review and closing encounter.

## 2023-08-01 NOTE — Telephone Encounter (Signed)
Patient returning call to f/u on previous conversation with Ladona Ridgel.

## 2023-08-01 NOTE — Progress Notes (Signed)
Pt notified and voiced understanding. Closing encounter.

## 2023-08-08 ENCOUNTER — Ambulatory Visit
Admission: RE | Admit: 2023-08-08 | Discharge: 2023-08-08 | Disposition: A | Discharge status: Home / Self Care | Payer: No Typology Code available for payment source | Source: Ambulatory Visit | Attending: Obstetrics and Gynecology | Admitting: Obstetrics and Gynecology

## 2023-08-08 DIAGNOSIS — Z1231 Encounter for screening mammogram for malignant neoplasm of breast: Secondary | ICD-10-CM

## 2024-07-01 ENCOUNTER — Other Ambulatory Visit: Payer: Self-pay | Admitting: Obstetrics and Gynecology

## 2024-07-01 DIAGNOSIS — Z1231 Encounter for screening mammogram for malignant neoplasm of breast: Secondary | ICD-10-CM

## 2024-07-26 ENCOUNTER — Ambulatory Visit: Payer: No Typology Code available for payment source | Admitting: Obstetrics and Gynecology

## 2024-07-29 ENCOUNTER — Ambulatory Visit: Admitting: Obstetrics and Gynecology

## 2024-07-29 ENCOUNTER — Encounter: Payer: Self-pay | Admitting: Obstetrics and Gynecology

## 2024-07-29 VITALS — BP 132/98 | HR 71 | Ht 64.0 in | Wt 235.0 lb

## 2024-07-29 DIAGNOSIS — Z1331 Encounter for screening for depression: Secondary | ICD-10-CM

## 2024-07-29 DIAGNOSIS — Z01419 Encounter for gynecological examination (general) (routine) without abnormal findings: Secondary | ICD-10-CM

## 2024-07-29 MED ORDER — ESTRADIOL 0.05 MG/24HR TD PTTW: 1.0000 | MEDICATED_PATCH | TRANSDERMAL | 12 refills | Status: AC

## 2024-07-29 MED ORDER — PROGESTERONE MICRONIZED 100 MG PO CAPS: 100.0000 mg | ORAL_CAPSULE | Freq: Every day | ORAL | 6 refills | Status: AC

## 2024-07-29 NOTE — Progress Notes (Signed)
 58 y.o. y.o. female here for annual exam. Patient's last menstrual period was 02/27/2019.   History of ablation and tubal Patient's last menstrual period was 02/27/2019.          Sexually active: Yes.    Exercising: Yes.    Walkiing  Smoker:  no  Health Maintenance: Pap:  06/04/21 WNL Hr HPV neg, 01-04-17 normal, neg HPV History of abnormal Pap:  no MMG: 2025  BMD:   at age 66 no risk factors Colonoscopy: 12/27/21 normal f/u 10 years  TDaP:  01/27/21 Could not afford the combi patch. Having hot flashes at night. Could not afford intrarosa . To try vivelle patch with prometrium.  Discussed she has to be on progesterone with having a uterus and using estrogen. Counseled on safety of estrogen patch. To watch her bp.  Body mass index is 40.34 kg/m.     07/29/2024    1:32 PM 04/01/2015   10:05 AM 02/24/2014    8:47 AM  Depression screen PHQ 2/9  Decreased Interest 0 0 0  Down, Depressed, Hopeless 0 0 0  PHQ - 2 Score 0 0 0    Blood pressure (!) 132/98, pulse 71, height 5' 4 (1.626 m), weight 235 lb (106.6 kg), last menstrual period 02/27/2019, SpO2 92%.     Component Value Date/Time   DIAGPAP  07/26/2023 1555    - Negative for intraepithelial lesion or malignancy (NILM)   DIAGPAP  06/04/2021 0956    - Negative for intraepithelial lesion or malignancy (NILM)   HPVHIGH Negative 07/26/2023 1555   HPVHIGH Negative 06/04/2021 0956   ADEQPAP  07/26/2023 1555    Satisfactory for evaluation; transformation zone component PRESENT.   ADEQPAP  06/04/2021 0956    Satisfactory for evaluation; transformation zone component PRESENT.    GYN HISTORY:    Component Value Date/Time   DIAGPAP  07/26/2023 1555    - Negative for intraepithelial lesion or malignancy (NILM)   DIAGPAP  06/04/2021 0956    - Negative for intraepithelial lesion or malignancy (NILM)   HPVHIGH Negative 07/26/2023 1555   HPVHIGH Negative 06/04/2021 0956   ADEQPAP  07/26/2023 1555    Satisfactory for evaluation;  transformation zone component PRESENT.   ADEQPAP  06/04/2021 0956    Satisfactory for evaluation; transformation zone component PRESENT.    OB History  Gravida Para Term Preterm AB Living  2 2 2  0 0 2  SAB IAB Ectopic Multiple Live Births  0 0 0 0 2    # Outcome Date GA Lbr Len/2nd Weight Sex Type Anes PTL Lv  2 Term 1998    F Vag-Spont   LIV  1 Term 1995    M Vag-Spont   LIV    Past Medical History:  Diagnosis Date   Asthma with bronchitis 08/2013   Childhood asthma    Hypertension    Menorrhagia    Obesity    Pregnancy induced hypertension 1995    Past Surgical History:  Procedure Laterality Date   ENDOMETRIAL ABLATION  10/18/2007   INCISION AND DRAINAGE ABSCESS Left 06/17/2013   left breast   TUBAL LIGATION  2009    Current Outpatient Medications on File Prior to Visit  Medication Sig Dispense Refill   BIOTIN PO Take 50,000 mcg by mouth 3 (three) times a week.     Cholecalciferol (VITAMIN D3 PO) Take by mouth 3 (three) times a week.     hydrochlorothiazide  (HYDRODIURIL ) 25 MG tablet Take 1 tablet (25 mg total)  by mouth daily. 90 tablet 3   metoprolol succinate (TOPROL-XL) 25 MG 24 hr tablet Take 25 mg by mouth daily.     Multiple Vitamin (MULTIVITAMIN WITH MINERALS) TABS tablet Take 1 tablet by mouth 3 (three) times a week.     potassium chloride  SA (KLOR-CON ) 20 MEQ tablet Take 1 tablet (20 mEq total) by mouth daily. 90 tablet 3   Probiotic Product (PROBIOTIC PO) Take by mouth.     No current facility-administered medications on file prior to visit.    Social History   Socioeconomic History   Marital status: Married    Spouse name: Not on file   Number of children: Not on file   Years of education: Not on file   Highest education level: Not on file  Occupational History   Occupation: Clinical biochemist Rep    Employer: [employer]  Tobacco Use   Smoking status: Never   Smokeless tobacco: Never  Vaping Use   Vaping status: Never Used  Substance  and Sexual Activity   Alcohol use: No   Drug use: No   Sexual activity: Yes    Partners: Male    Birth control/protection: Post-menopausal    Comment: ablation, BTL  Other Topics Concern   Not on file  Social History Narrative   Not on file   Social Drivers of Health   Financial Resource Strain: Low Risk  (08/02/2021)   Received from [facility] visits prior to 12/17/2022.   Overall Financial Resource Strain (CARDIA)    Difficulty of Paying Living Expenses: Not hard at all  Food Insecurity: Low Risk  (08/06/2023)   Received from [facility]   Hunger Vital Sign    Within the past 12 months, you worried that your food would run out before you got money to buy more: Never true    Within the past 12 months, the food you bought just didn't last and you didn't have money to get more. : Never true  Transportation Needs: No Transportation Needs (08/06/2023)   Received from [facility]    In the past 12 months, has lack of reliable transportation kept you from medical appointments, meetings, work or from getting things needed for daily living? : No  Physical Activity: Sufficiently Active (08/02/2021)   Received from [facility] visits prior to 12/17/2022.   Exercise Vital Sign    On average, how many days per week do you engage in moderate to strenuous exercise (like a brisk walk)?: 6 days    On average, how many minutes do you engage in exercise at this level?: 30 min  Stress: No Stress Concern Present (08/02/2021)   Received from [facility] visits prior to 12/17/2022.   [employer] of Occupational Health - Occupational Stress Questionnaire    Feeling of Stress : Not at all  Social Connections: Socially Integrated (08/02/2021)   Received from [facility] visits prior to 12/17/2022.   Social Connection and Isolation Panel    In a typical week, how many times do you talk on the  phone with family, friends, or neighbors?: More than three times a week    How often do you get together with friends or relatives?: Three times a week    How often do you attend church or religious services?: More than 4 times per year    Do you belong to any clubs or organizations such as church groups,  unions, fraternal or athletic groups, or school groups?: Yes    How often do you attend meetings of the clubs or organizations you belong to?: More than 4 times per year    Are you married, widowed, divorced, separated, never married, or living with a partner?: Married  Intimate Partner Violence: Not At Risk (08/02/2021)   Received from [facility] visits prior to 12/17/2022.   Humiliation, Afraid, Rape, and Kick questionnaire    Within the last year, have you been afraid of your partner or ex-partner?: No    Within the last year, have you been humiliated or emotionally abused in other ways by your partner or ex-partner?: No    Within the last year, have you been kicked, hit, slapped, or otherwise physically hurt by your partner or ex-partner?: No    Within the last year, have you been raped or forced to have any kind of sexual activity by your partner or ex-partner?: No    Family History  Problem Relation Age of Onset   Colon polyps Mother    Hypertension Mother    Hypertension Father    Diabetes Father    Hypertension Brother    Hyperlipidemia Brother    Hypertension Brother    Hyperlipidemia Brother    Lung cancer Maternal Uncle    Prostate cancer Maternal Uncle    Diabetic kidney disease Maternal Uncle    Breast cancer Neg Hx    Colon cancer Neg Hx      Allergies  Allergen Reactions   Aldactone  [Spironolactone ]     Breast enlargement   Augmentin  [Amoxicillin -Pot Clavulanate] Hives   Other     ALLERGIC TO ALL CILLINS-CAUSES HIVES   Penicillins     Rash, SOB      Patient's last menstrual period was Patient's last menstrual period was 02/27/2019.Ashley Schneider             Review of Systems Alls systems reviewed and are negative.     Physical Exam Constitutional:      Appearance: Normal appearance.  Genitourinary:     Vulva and urethral meatus normal.     No lesions in the vagina.     Right Labia: No rash, lesions or skin changes.    Left Labia: No lesions, skin changes or rash.    No vaginal discharge or tenderness.     No vaginal prolapse present.    No vaginal atrophy present.     Right Adnexa: not tender, not palpable and no mass present.    Left Adnexa: not tender, not palpable and no mass present.    No cervical motion tenderness or discharge.     Uterus is not enlarged, tender or irregular.  Breasts:    Right: Normal.     Left: Normal.  HENT:     Head: Normocephalic.  Neck:     Thyroid: No thyroid mass, thyromegaly or thyroid tenderness.  Cardiovascular:     Rate and Rhythm: Normal rate and regular rhythm.     Heart sounds: Normal heart sounds, S1 normal and S2 normal.  Pulmonary:     Effort: Pulmonary effort is normal.     Breath sounds: Normal breath sounds and air entry.  Abdominal:     General: There is no distension.     Palpations: Abdomen is soft. There is no mass.     Tenderness: There is no abdominal tenderness. There is no guarding or rebound.  Musculoskeletal:        General:  Normal range of motion.     Cervical back: Full passive range of motion without pain, normal range of motion and neck supple. No tenderness.     Right lower leg: No edema.     Left lower leg: No edema.  Neurological:     Mental Status: She is alert.  Skin:    General: Skin is warm.  Psychiatric:        Mood and Affect: Mood normal.        Behavior: Behavior normal.        Thought Content: Thought content normal.  Vitals and nursing note reviewed. Exam conducted with a chaperone present.       A:         Well Woman GYN exam             Vasomotor symptoms                P:        Pap smear not indicated Encouraged annual  mammogram screening Colon cancer screening up-to-date DXA not indicated Labs and immunizations to do with PMD Discussed breast self exams Encouraged healthy lifestyle practices Encouraged Vit D and Calcium  Could not afford the combi patch. Having hot flashes at night. Could not afford intrarosa . To try vivelle patch with prometrium.  Discussed she has to be on progesterone with having a uterus and using estrogen and risk for unnoposed estrogen and endometrial cancer. Counseled on safety of estrogen patch. To watch her bp. To see what cost would be [facility] at [facility]. Encouraged her to look as well for pharmacy pricing.  No follow-ups on file.  Ashley Schneider

## 2024-08-01 ENCOUNTER — Encounter: Payer: Self-pay | Admitting: Obstetrics and Gynecology

## 2024-08-08 ENCOUNTER — Ambulatory Visit
Admission: RE | Admit: 2024-08-08 | Discharge: 2024-08-08 | Disposition: A | Discharge status: Home / Self Care | Source: Ambulatory Visit | Attending: Obstetrics and Gynecology | Admitting: Obstetrics and Gynecology

## 2024-08-08 DIAGNOSIS — Z1231 Encounter for screening mammogram for malignant neoplasm of breast: Secondary | ICD-10-CM
